# Patient Record
Sex: Male | Born: 1955
Health system: Southern US, Community
[De-identification: ages and names within clinical notes are randomized; demographics above are authoritative.]

## PROBLEM LIST (undated history)

## (undated) DIAGNOSIS — F32A Depression, unspecified: Secondary | ICD-10-CM

## (undated) DIAGNOSIS — Z87442 Personal history of urinary calculi: Secondary | ICD-10-CM

## (undated) DIAGNOSIS — N189 Chronic kidney disease, unspecified: Secondary | ICD-10-CM

## (undated) DIAGNOSIS — T7840XA Allergy, unspecified, initial encounter: Secondary | ICD-10-CM

## (undated) DIAGNOSIS — E785 Hyperlipidemia, unspecified: Secondary | ICD-10-CM

## (undated) DIAGNOSIS — J45909 Unspecified asthma, uncomplicated: Secondary | ICD-10-CM

## (undated) DIAGNOSIS — E119 Type 2 diabetes mellitus without complications: Secondary | ICD-10-CM

## (undated) DIAGNOSIS — D126 Benign neoplasm of colon, unspecified: Secondary | ICD-10-CM

## (undated) DIAGNOSIS — I10 Essential (primary) hypertension: Secondary | ICD-10-CM

## (undated) DIAGNOSIS — Z5189 Encounter for other specified aftercare: Secondary | ICD-10-CM

## (undated) DIAGNOSIS — K5790 Diverticulosis of intestine, part unspecified, without perforation or abscess without bleeding: Secondary | ICD-10-CM

## (undated) DIAGNOSIS — F419 Anxiety disorder, unspecified: Secondary | ICD-10-CM

## (undated) DIAGNOSIS — B019 Varicella without complication: Secondary | ICD-10-CM

## (undated) HISTORY — DX: Unspecified asthma, uncomplicated: J45.909

## (undated) HISTORY — DX: Essential (primary) hypertension: I10

## (undated) HISTORY — PX: POLYPECTOMY: SHX149

## (undated) HISTORY — PX: HERNIA REPAIR: SHX51

## (undated) HISTORY — DX: Depression, unspecified: F32.A

## (undated) HISTORY — DX: Diverticulosis of intestine, part unspecified, without perforation or abscess without bleeding: K57.90

## (undated) HISTORY — DX: Anxiety disorder, unspecified: F41.9

## (undated) HISTORY — DX: Benign neoplasm of colon, unspecified: D12.6

## (undated) HISTORY — DX: Encounter for other specified aftercare: Z51.89

## (undated) HISTORY — DX: Type 2 diabetes mellitus without complications: E11.9

## (undated) HISTORY — PX: WISDOM TOOTH EXTRACTION: SHX21

## (undated) HISTORY — DX: Personal history of urinary calculi: Z87.442

## (undated) HISTORY — DX: Hyperlipidemia, unspecified: E78.5

## (undated) HISTORY — DX: Chronic kidney disease, unspecified: N18.9

## (undated) HISTORY — DX: Varicella without complication: B01.9

## (undated) HISTORY — PX: NASAL SINUS SURGERY: SHX719

## (undated) HISTORY — DX: Allergy, unspecified, initial encounter: T78.40XA

---

## 2006-11-05 HISTORY — PX: COLONOSCOPY: SHX174

## 2014-09-17 ENCOUNTER — Encounter (INDEPENDENT_AMBULATORY_CARE_PROVIDER_SITE_OTHER): Payer: Self-pay

## 2014-09-17 ENCOUNTER — Encounter: Payer: Self-pay | Admitting: Internal Medicine

## 2014-09-17 ENCOUNTER — Ambulatory Visit (INDEPENDENT_AMBULATORY_CARE_PROVIDER_SITE_OTHER): Payer: BC Managed Care – PPO | Admitting: Internal Medicine

## 2014-09-17 VITALS — BP 122/68 | HR 97 | Temp 98.6°F | Ht 69.25 in | Wt 185.0 lb

## 2014-09-17 DIAGNOSIS — E785 Hyperlipidemia, unspecified: Secondary | ICD-10-CM

## 2014-09-17 DIAGNOSIS — I10 Essential (primary) hypertension: Secondary | ICD-10-CM | POA: Insufficient documentation

## 2014-09-17 DIAGNOSIS — E1169 Type 2 diabetes mellitus with other specified complication: Secondary | ICD-10-CM | POA: Insufficient documentation

## 2014-09-17 DIAGNOSIS — K219 Gastro-esophageal reflux disease without esophagitis: Secondary | ICD-10-CM

## 2014-09-17 DIAGNOSIS — J4530 Mild persistent asthma, uncomplicated: Secondary | ICD-10-CM | POA: Insufficient documentation

## 2014-09-17 NOTE — Assessment & Plan Note (Signed)
No recent flares on Advair and Singulair Rarely uses the albuterol

## 2014-09-17 NOTE — Patient Instructions (Signed)

## 2014-09-17 NOTE — Progress Notes (Signed)
Pre visit review using our clinic review tool, if applicable. No additional management support is needed unless otherwise documented below in the visit note. 

## 2014-09-17 NOTE — Progress Notes (Signed)
HPI  Pt presents to the clinic today to establish care. He recently moved from [place]. He is transferring care from Dr. Lunette Stands.   He does have some concerns today about a lesion on the left side of his face. He noticed this one year ago. He reports that it does seem to be getting bigger. It does not itch or hurt. It has not changed in color. He has not had it evaluated before.  Flu: 07/2014 Tetanus: 2014 Pneumonia vaccine: 2013 Zostovax 2013 PSA Screening: yearly Colon Screening: 2008 Vision Screening: 11/2013 Dentist: biannually  Asthma: Mild, persistent. Well controlled on  Advair, singulair. Uses albuterol prn.  HTN: Well controlled on Hyzaar. Denies lower extremity edema.  HLD: Denies myalgias on zocor.   Past Medical History  Diagnosis Date  . Asthma   . Chicken pox   . Allergy   . Hyperlipidemia   . Hypertension   . Personal history of kidney stones     Current Outpatient Prescriptions  Medication Sig Dispense Refill  . fluticasone-salmeterol (ADVAIR HFA) 230-21 MCG/ACT inhaler Inhale 2 puffs into the lungs 2 (two) times daily.    Marland Kitchen losartan-hydrochlorothiazide (HYZAAR) 100-12.5 MG per tablet     . montelukast (SINGULAIR) 10 MG tablet Take 10 mg by mouth at bedtime.    Marland Kitchen PROAIR HFA 108 (90 BASE) MCG/ACT inhaler     . simvastatin (ZOCOR) 20 MG tablet      No current facility-administered medications for this visit.    No Known Allergies  Family History  Problem Relation Age of Onset  . Hypertension Mother   . Alcohol abuse Father   . Cancer Father     Lung  . Diabetes Neg Hx   . Heart disease Neg Hx   . Stroke Neg Hx     History   Social History  . Marital Status: Married    Spouse Name: N/A    Number of Children: N/A  . Years of Education: N/A   Occupational History  . Not on file.   Social History Main Topics  . Smoking status: Never Smoker   . Smokeless tobacco: Never Used  . Alcohol Use: 0.0 oz/week    0 Not specified per week   Comment: occasional  . Drug Use: No  . Sexual Activity: Not Currently   Other Topics Concern  . Not on file   Social History Narrative  . No narrative on file    ROS:  Constitutional: Denies fever, malaise, fatigue, headache or abrupt weight changes.  Respiratory: Denies difficulty breathing, shortness of breath, cough or sputum production.   Cardiovascular: Denies chest pain, chest tightness, palpitations or swelling in the hands or feet.  Gastrointestinal: Pt reports occasional GERD. Denies abdominal pain, bloating, constipation, diarrhea or blood in the stool.  Skin: Pt reports lesion on left side of face. Denies redness, rashes or ulcercations.  Neurological: Denies dizziness, difficulty with memory, difficulty with speech or problems with balance and coordination.   No other specific complaints in a complete review of systems (except as listed in HPI above).  PE:  BP 122/68 mmHg  Pulse 97  Temp(Src) 98.6 F (37 C) (Oral)  Ht 5' 9.25" (1.759 m)  Wt 185 lb (83.915 kg)  BMI 27.12 kg/m2  SpO2 98% Wt Readings from Last 3 Encounters:  09/17/14 185 lb (83.915 kg)    General: Appears his stated age, well developed, well nourished in NAD. Skin: Nodular lesion noted on left temple, appears to be a nevus. Cardiovascular:  Normal rate and rhythm. S1,S2 noted.  No murmur, rubs or gallops noted. No JVD or BLE edema. No carotid bruits noted. Pulmonary/Chest: Normal effort and positive vesicular breath sounds. No respiratory distress. No wheezes, rales or ronchi noted.  Abdomen: Soft and nontender. Normal bowel sounds, no bruits noted. No distention or masses noted. Liver, spleen and kidneys non palpable. Neurological: Alert and oriented.    Assessment and Plan:  Lesion left side of face:  Looks like a nevus He is concerned that it is getting bigger in size He will self refer to dermatology RTC in 6 months for follow up of chronic conditions

## 2014-09-17 NOTE — Assessment & Plan Note (Signed)
No issues on zocor Will continue current dose at this time

## 2014-09-17 NOTE — Assessment & Plan Note (Signed)
Well controlled on Hyzaar Will continue current dose at this time

## 2014-09-17 NOTE — Assessment & Plan Note (Signed)
Occasional Relieved with prn Tums or Rolaids

## 2015-03-18 ENCOUNTER — Telehealth: Payer: Self-pay | Admitting: Internal Medicine

## 2015-03-18 ENCOUNTER — Ambulatory Visit: Payer: BC Managed Care – PPO | Admitting: Internal Medicine

## 2015-03-18 DIAGNOSIS — Z0289 Encounter for other administrative examinations: Secondary | ICD-10-CM

## 2015-03-18 NOTE — Telephone Encounter (Signed)
Yes he needs to follow up

## 2015-03-18 NOTE — Telephone Encounter (Signed)
Patient did not come in for their appointment todayfor 6 month follow up  Please let me know if patient needs to be contacted immediately for follow up or no follow up needed. °

## 2015-03-21 NOTE — Telephone Encounter (Signed)
Called and l/m to r/s appt mf

## 2015-03-23 NOTE — Telephone Encounter (Signed)
L/m to r/s appt  °

## 2015-03-29 NOTE — Telephone Encounter (Signed)
L/m for pt to return call, need to r/s appt

## 2015-05-18 ENCOUNTER — Ambulatory Visit (INDEPENDENT_AMBULATORY_CARE_PROVIDER_SITE_OTHER): Payer: 59 | Admitting: Internal Medicine

## 2015-05-18 ENCOUNTER — Encounter: Payer: Self-pay | Admitting: Internal Medicine

## 2015-05-18 VITALS — BP 134/84 | HR 74 | Temp 98.3°F | Ht 69.12 in | Wt 199.0 lb

## 2015-05-18 DIAGNOSIS — Z0000 Encounter for general adult medical examination without abnormal findings: Secondary | ICD-10-CM | POA: Diagnosis not present

## 2015-05-18 DIAGNOSIS — K219 Gastro-esophageal reflux disease without esophagitis: Secondary | ICD-10-CM | POA: Diagnosis not present

## 2015-05-18 DIAGNOSIS — D229 Melanocytic nevi, unspecified: Secondary | ICD-10-CM

## 2015-05-18 DIAGNOSIS — Z125 Encounter for screening for malignant neoplasm of prostate: Secondary | ICD-10-CM | POA: Diagnosis not present

## 2015-05-18 DIAGNOSIS — Z1211 Encounter for screening for malignant neoplasm of colon: Secondary | ICD-10-CM | POA: Diagnosis not present

## 2015-05-18 DIAGNOSIS — D239 Other benign neoplasm of skin, unspecified: Secondary | ICD-10-CM

## 2015-05-18 DIAGNOSIS — I10 Essential (primary) hypertension: Secondary | ICD-10-CM

## 2015-05-18 DIAGNOSIS — J4530 Mild persistent asthma, uncomplicated: Secondary | ICD-10-CM | POA: Diagnosis not present

## 2015-05-18 DIAGNOSIS — E785 Hyperlipidemia, unspecified: Secondary | ICD-10-CM

## 2015-05-18 DIAGNOSIS — Z23 Encounter for immunization: Secondary | ICD-10-CM | POA: Diagnosis not present

## 2015-05-18 MED ORDER — SIMVASTATIN 20 MG PO TABS: 20.0000 mg | ORAL_TABLET | Freq: Every day | ORAL | Status: DC

## 2015-05-18 MED ORDER — LOSARTAN POTASSIUM-HCTZ 100-12.5 MG PO TABS: 1.0000 | ORAL_TABLET | Freq: Every day | ORAL | Status: DC

## 2015-05-18 NOTE — Assessment & Plan Note (Signed)
Currently not an issue Discussed how weight loss could improve reflux symptoms

## 2015-05-18 NOTE — Assessment & Plan Note (Signed)
He will continue to follow with pulmonology. 

## 2015-05-18 NOTE — Progress Notes (Signed)
Pre visit review using our clinic review tool, if applicable. No additional management support is needed unless otherwise documented below in the visit note. 

## 2015-05-18 NOTE — Assessment & Plan Note (Addendum)
Will check Lipid profile and CMET today Encouraged him to consume a low fat diet Zocor refilled today

## 2015-05-18 NOTE — Assessment & Plan Note (Addendum)
Well controlled ECG today normal CBC and CMET today Losartan HCT refilled today

## 2015-05-18 NOTE — Progress Notes (Signed)
Subjective:    Patient ID: Nathan Conway, male    DOB: 1956-08-05, 59 y.o.   MRN: [id number]  HPI   Pt presents to the clinic today for his annual exam. He is also due for follow up of chronic conditions. He reports a mole on his left temple that he noticed 2 years ago; it has been growing bigger. He has a dermatology appointment coming up in 2 weeks.   Flu: 07/2014 Tetanus: 2015 Pneumovax: 2013 Prevnar: never Zostovax: 2013 PSA Screening: unsure Colon Screening: 2005, would like a referral Vision Screening: 11/2013 Dentist: 2015; is looking for a new dentist  Diet: He is eating more fast food. He does like vegetables but does not eat many fruits. Exercise: None; he does a lot of outdoor manual labor and stays hydrated.   HTN: BP well controlled on Losartan-HCT. His BP today is 134/84. He will need a refill on his medications today.  HLD: He denies myalgias on Zocor. He hopes to get away from eating so much fast food and starting to take his lunch again. He needs a refill on his medications today.  GERD: No complaints; he says he has never had GERD issues.  Mild Persistent Asthma: Not taken Advair since January. Has not taken Singulair since November 2016; uses it in the fall season prn. His symptoms are controlled lately even without the medicines. He has not used rescue inhaler recently. Memory Dance is working well. He will see his pulmonologist in 1 month.  Quit smoking aging 21. Smoked for 10 years.   Review of Systems      Past Medical History  Diagnosis Date  . Asthma   . Chicken pox   . Allergy   . Hyperlipidemia   . Hypertension   . Personal history of kidney stones     Current Outpatient Prescriptions  Medication Sig Dispense Refill  . Fluticasone Furoate-Vilanterol (BREO ELLIPTA) 200-25 MCG/INH AEPB Inhale 2 puffs into the lungs daily.    Marland Kitchen losartan-hydrochlorothiazide (HYZAAR) 100-12.5 MG per tablet Take 1 tablet by mouth daily.     . montelukast (SINGULAIR) 10  MG tablet Take 10 mg by mouth daily as needed.     Marland Kitchen PROAIR HFA 108 (90 BASE) MCG/ACT inhaler Inhale 1-2 puffs into the lungs as needed.     . simvastatin (ZOCOR) 20 MG tablet Take 20 mg by mouth daily at 6 PM.      No current facility-administered medications for this visit.    No Known Allergies  Family History  Problem Relation Age of Onset  . Hypertension Mother   . Alcohol abuse Father   . Cancer Father     Lung  . Diabetes Neg Hx   . Heart disease Neg Hx   . Stroke Neg Hx     History   Social History  . Marital Status: Married    Spouse Name: N/A  . Number of Children: N/A  . Years of Education: N/A   Occupational History  . Not on file.   Social History Main Topics  . Smoking status: Never Smoker   . Smokeless tobacco: Never Used  . Alcohol Use: 0.0 oz/week    0 Standard drinks or equivalent per week     Comment: occasional  . Drug Use: No  . Sexual Activity: Not Currently   Other Topics Concern  . Not on file   Social History Narrative     Constitutional: Denies fever, malaise, fatigue, headache or abrupt weight changes.  HEENT:  Denies eye pain, eye redness, ear pain, ringing in the ears, wax buildup, runny nose, nasal congestion, or sore throat. Respiratory: Denies difficulty breathing, shortness of breath, cough or sputum production.   Cardiovascular: Denies chest pain, chest tightness, palpitations or swelling in the hands or feet.  Gastrointestinal: Denies abdominal pain, bloating, constipation, diarrhea or blood in the stool.  GU: Denies urgency, frequency, pain with urination, burning sensation, blood in urine, odor or discharge. Musculoskeletal:  Denies decrease in range of motion, difficulty with gait, muscle pain or joint pain and swelling.  Skin: Pt reports abnormal mole on left temple. Denies redness, rashes, or ulcercations.  Neurological: Denies dizziness, difficulty with memory, difficulty with speech or problems with balance and  coordination.  Psych: Denies anxiety, depression, SI/HI.  No other specific complaints in a complete review of systems (except as listed in HPI above).  Objective:   Physical Exam   BP 134/84 mmHg  Pulse 74  Temp(Src) 98.3 F (36.8 C) (Oral)  Ht 5' 9.12" (1.756 m)  Wt 199 lb (90.266 kg)  BMI 29.27 kg/m2  SpO2 97% Wt Readings from Last 3 Encounters:  05/18/15 199 lb (90.266 kg)  09/17/14 185 lb (83.915 kg)    General: Appears his stated age, obese in NAD. Skin: Warm, dry and intact. 30mm hyperpigmented papule noted on left temple. HEENT: Head: normal shape and size; Eyes: sclera white, no icterus, conjunctiva pink, PERRLA and EOMs intact; Ears: Tm's gray and intact, normal light reflex; Throat/Mouth: Teeth present, mucosa pink and moist, no exudate, lesions or ulcerations noted.  Neck: Neck supple, trachea midline. No masses, lumps or thyromegaly present.  Cardiovascular: Normal rate and rhythm. S1,S2 noted.  No murmur, rubs or gallops noted. No JVD or BLE edema. No carotid bruits noted. Pulmonary/Chest: Normal effort and positive vesicular breath sounds. No respiratory distress. No wheezes, rales or ronchi noted.  Abdomen: Distended but soft and nontender. Normal bowel sounds, no bruits noted. No distention or masses noted. Liver, spleen and kidneys non palpable. Musculoskeletal: Normal range of motion. Strength 5/5 BUE/BLE. No difficulty with gait.  Neurological: Alert and oriented. Cranial nerves II-XII grossly intact. Coordination normal.  Psychiatric: Mood and affect normal. Behavior is normal. Judgment and thought content normal.        Assessment & Plan:   Preventative Health Maintenance:  Encouraged him to work on diet and exercise Prevnar today Will refer to GI for screening colonoscopy Will refer to opthalmology for annual eye exam  Atypical nevus:  He has a derm appt in 2 weeks He will follow up with them RTC in 6 months to follow up chronic conditions CBC,  CMET, TSH, A1C, Lipids and PSA today

## 2015-05-18 NOTE — Patient Instructions (Signed)

## 2015-05-19 ENCOUNTER — Encounter: Payer: Self-pay | Admitting: Gastroenterology

## 2015-05-19 ENCOUNTER — Other Ambulatory Visit: Payer: Self-pay | Admitting: Internal Medicine

## 2015-05-19 DIAGNOSIS — R748 Abnormal levels of other serum enzymes: Secondary | ICD-10-CM

## 2015-05-19 LAB — COMPREHENSIVE METABOLIC PANEL
ALT: 65 U/L — ABNORMAL HIGH (ref 0–53)
AST: 59 U/L — ABNORMAL HIGH (ref 0–37)
Albumin: 4.1 g/dL (ref 3.5–5.2)
Alkaline Phosphatase: 61 U/L (ref 39–117)
BUN: 11 mg/dL (ref 6–23)
CALCIUM: 9.4 mg/dL (ref 8.4–10.5)
CHLORIDE: 103 meq/L (ref 96–112)
CO2: 29 meq/L (ref 19–32)
Creatinine, Ser: 0.95 mg/dL (ref 0.40–1.50)
GFR: 85.68 mL/min (ref >60.00)
GLUCOSE: 104 mg/dL — AB (ref 70–99)
POTASSIUM: 4.1 meq/L (ref 3.5–5.1)
Sodium: 142 mEq/L (ref 135–145)
TOTAL PROTEIN: 7.1 g/dL (ref 6.0–8.3)
Total Bilirubin: 0.7 mg/dL (ref 0.2–1.2)

## 2015-05-19 LAB — PSA: PSA: 1 ng/mL (ref 0.10–4.00)

## 2015-05-19 LAB — CBC WITH DIFFERENTIAL/PLATELET
BASOS ABS: 0.1 10*3/uL (ref 0.0–0.1)
Basophils Relative: 1.2 % (ref 0.0–3.0)
Eosinophils Absolute: 0.3 10*3/uL (ref 0.0–0.7)
Eosinophils Relative: 3.1 % (ref 0.0–5.0)
HCT: 41.1 % (ref 39.0–52.0)
Hemoglobin: 13.7 g/dL (ref 13.0–17.0)
LYMPHS PCT: 24.2 % (ref 12.0–46.0)
Lymphs Abs: 2.6 10*3/uL (ref 0.7–4.0)
MCHC: 33.3 g/dL (ref 30.0–36.0)
MCV: 100.3 fl — ABNORMAL HIGH (ref 78.0–100.0)
Monocytes Absolute: 0.7 10*3/uL (ref 0.1–1.0)
Monocytes Relative: 6.4 % (ref 3.0–12.0)
NEUTROS ABS: 7 10*3/uL (ref 1.4–7.7)
NEUTROS PCT: 65.1 % (ref 43.0–77.0)
Platelets: 259 10*3/uL (ref 150.0–400.0)
RBC: 4.09 Mil/uL — ABNORMAL LOW (ref 4.22–5.81)
RDW: 14.6 % (ref 11.5–15.5)
WBC: 10.7 10*3/uL — AB (ref 4.0–10.5)

## 2015-05-19 LAB — LIPID PANEL
CHOL/HDL RATIO: 3
Cholesterol: 146 mg/dL (ref 0–200)
HDL: 51.7 mg/dL (ref >39.00)
LDL Cholesterol: 76 mg/dL (ref 0–99)
NonHDL: 94.3
TRIGLYCERIDES: 90 mg/dL (ref 0.0–149.0)
VLDL: 18 mg/dL (ref 0.0–40.0)

## 2015-05-19 LAB — TSH: TSH: 1 u[IU]/mL (ref 0.35–4.50)

## 2015-05-19 LAB — HEMOGLOBIN A1C: Hgb A1c MFr Bld: 6.2 % (ref 4.6–6.5)

## 2015-05-20 NOTE — Addendum Note (Signed)
Addended by: Lurlean Nanny on: 05/20/2015 08:47 AM   Modules accepted: Orders

## 2015-06-10 ENCOUNTER — Ambulatory Visit
Admission: RE | Admit: 2015-06-10 | Discharge: 2015-06-10 | Disposition: A | Discharge status: Home / Self Care | Payer: 59 | Source: Ambulatory Visit | Attending: Internal Medicine | Admitting: Internal Medicine

## 2015-06-10 DIAGNOSIS — R748 Abnormal levels of other serum enzymes: Secondary | ICD-10-CM | POA: Insufficient documentation

## 2015-06-10 DIAGNOSIS — K760 Fatty (change of) liver, not elsewhere classified: Secondary | ICD-10-CM | POA: Diagnosis not present

## 2015-08-05 ENCOUNTER — Encounter: Payer: 59 | Admitting: Gastroenterology

## 2015-09-14 ENCOUNTER — Telehealth: Payer: Self-pay | Admitting: Internal Medicine

## 2015-09-14 NOTE — Telephone Encounter (Signed)
Pt called ? The no show charge for 03/17/14 He stated he does not remember making that appointment and at the time he had no insurance  When he got new insurance he called to schedule his cpx

## 2015-09-19 ENCOUNTER — Ambulatory Visit (AMBULATORY_SURGERY_CENTER): Payer: Self-pay | Admitting: *Deleted

## 2015-09-19 VITALS — Ht 70.0 in | Wt 197.6 lb

## 2015-09-19 DIAGNOSIS — Z86010 Personal history of colonic polyps: Secondary | ICD-10-CM

## 2015-09-19 MED ORDER — SUPREP BOWEL PREP KIT 17.5-3.13-1.6 GM/177ML PO SOLN: 1.0000 | Freq: Once | ORAL | Status: DC

## 2015-09-19 NOTE — Progress Notes (Signed)
Patient denies any allergies to egg or soy products. Patient denies complications with anesthesia/sedation.  Patient denies oxygen use at home and denies diet medications. Emmi instructions for colonoscopy explained and given to patient.  

## 2015-09-22 ENCOUNTER — Encounter: Payer: Self-pay | Admitting: Gastroenterology

## 2015-10-03 ENCOUNTER — Ambulatory Visit (AMBULATORY_SURGERY_CENTER): Payer: 59 | Admitting: Gastroenterology

## 2015-10-03 ENCOUNTER — Encounter: Payer: Self-pay | Admitting: Gastroenterology

## 2015-10-03 VITALS — BP 134/76 | HR 70 | Temp 98.5°F | Resp 19 | Ht 70.0 in | Wt 197.0 lb

## 2015-10-03 DIAGNOSIS — Z86010 Personal history of colonic polyps: Secondary | ICD-10-CM

## 2015-10-03 DIAGNOSIS — D125 Benign neoplasm of sigmoid colon: Secondary | ICD-10-CM | POA: Diagnosis not present

## 2015-10-03 DIAGNOSIS — D124 Benign neoplasm of descending colon: Secondary | ICD-10-CM

## 2015-10-03 DIAGNOSIS — D122 Benign neoplasm of ascending colon: Secondary | ICD-10-CM | POA: Diagnosis not present

## 2015-10-03 DIAGNOSIS — D123 Benign neoplasm of transverse colon: Secondary | ICD-10-CM

## 2015-10-03 DIAGNOSIS — D120 Benign neoplasm of cecum: Secondary | ICD-10-CM

## 2015-10-03 MED ORDER — SODIUM CHLORIDE 0.9 % IV SOLN: 500.0000 mL | INTRAVENOUS | Status: DC

## 2015-10-03 NOTE — Patient Instructions (Addendum)
Impressions/recommendations;  Polyps (handout given) Diverticulosis (handout given) High Fiber Diet (handout given) Hemorrhoids (handout given)  No aspirin containing products, NSAIDS, or antiinflammatory medications for 2 weeks. May resume 10/18/15. If needed, Tylenol only.  YOU HAD AN ENDOSCOPIC PROCEDURE TODAY AT [facility]:   Refer to the procedure report that was given to you for any specific questions about what was found during the examination.  If the procedure report does not answer your questions, please call your gastroenterologist to clarify.  If you requested that your care partner not be given the details of your procedure findings, then the procedure report has been included in a sealed envelope for you to review at your convenience later.  YOU SHOULD EXPECT: Some feelings of bloating in the abdomen. Passage of more gas than usual.  Walking can help get rid of the air that was put into your GI tract during the procedure and reduce the bloating. If you had a lower endoscopy (such as a colonoscopy or flexible sigmoidoscopy) you may notice spotting of blood in your stool or on the toilet paper. If you underwent a bowel prep for your procedure, you may not have a normal bowel movement for a few days.  Please Note:  You might notice some irritation and congestion in your nose or some drainage.  This is from the oxygen used during your procedure.  There is no need for concern and it should clear up in a day or so.  SYMPTOMS TO REPORT IMMEDIATELY:   Following lower endoscopy (colonoscopy or flexible sigmoidoscopy):  Excessive amounts of blood in the stool  Significant tenderness or worsening of abdominal pains  Swelling of the abdomen that is new, acute  Fever of 100F or higher  For urgent or emergent issues, a gastroenterologist can be reached at any hour by calling [phone].   DIET: Your first meal following the procedure should be a small meal and then  it is ok to progress to your normal diet. Heavy or fried foods are harder to digest and may make you feel nauseous or bloated.  Likewise, meals heavy in dairy and vegetables can increase bloating.  Drink plenty of fluids but you should avoid alcoholic beverages for 24 hours.  ACTIVITY:  You should plan to take it easy for the rest of today and you should NOT DRIVE or use heavy machinery until tomorrow (because of the sedation medicines used during the test).    FOLLOW UP: Our staff will call the number listed on your records the next business day following your procedure to check on you and address any questions or concerns that you may have regarding the information given to you following your procedure. If we do not reach you, we will leave a message.  However, if you are feeling well and you are not experiencing any problems, there is no need to return our call.  We will assume that you have returned to your regular daily activities without incident.  If any biopsies were taken you will be contacted by phone or by letter within the next 1-3 weeks.  Please call us at [phone] if you have not heard about the biopsies in 3 weeks.    SIGNATURES/CONFIDENTIALITY: You and/or your care partner have signed paperwork which will be entered into your electronic medical record.  These signatures attest to the fact that that the information above on your After Visit Summary has been reviewed and is understood.  Full responsibility of the confidentiality  of this discharge information lies with you and/or your care-partner.

## 2015-10-03 NOTE — Progress Notes (Signed)
Called to room to assist during endoscopic procedure.  Patient ID and intended procedure confirmed with present staff. Received instructions for my participation in the procedure from the performing physician.  

## 2015-10-03 NOTE — Progress Notes (Signed)
Report to PACU, RN, vss, BBS= Clear.  

## 2015-10-03 NOTE — Op Note (Signed)
[facility]  [address] [place], [id number]   COLONOSCOPY PROCEDURE REPORT  PATIENT: Nathan Conway, Nathan Conway  MR#: [id number] BIRTHDATE: 1955-12-21 , 44  yrs. old GENDER: male ENDOSCOPIST: Ladene Artist, MD, [facility] REFERRED BY: Webb Silversmith, NP PROCEDURE DATE:  10/03/2015 PROCEDURE:   Colonoscopy, surveillance and Colonoscopy with snare polypectomy First Screening Colonoscopy - Avg.  risk and is 50 yrs.  old or older - No.  Prior Negative Screening - Now for repeat screening. N/A  History of Adenoma - Now for follow-up colonoscopy & has been > or = to 3 yrs.  Yes hx of adenoma.  Has been 3 or more years since last colonoscopy.  Polyps removed today? Yes ASA CLASS:   Class II INDICATIONS:Surveillance due to prior colonic neoplasia and PH Colon Adenoma. MEDICATIONS: Monitored anesthesia care and Propofol 300 mg IV DESCRIPTION OF PROCEDURE:   After the risks benefits and alternatives of the procedure were thoroughly explained, informed consent was obtained.  The digital rectal exam revealed no abnormalities of the rectum.   The LB [id number]  endoscope was introduced through the anus and advanced to the cecum, which was identified by both the appendix and ileocecal valve. No adverse events experienced.   The quality of the prep was good.  (Suprep was used)  The instrument was then slowly withdrawn as the colon was fully examined. Estimated blood loss is zero unless otherwise noted in this procedure report.    COLON FINDINGS: Six sessile polyps measuring 6-7 mm in size were found in the transverse colon(2), descending colon (1), at the cecum (1), and in the ascending colon (2).  Polypectomies were performed with a cold snare.  The resection was complete, the polyp tissue was completely retrieved and sent to histology.   A semi-pedunculated polyp measuring 8 mm in size was found in the sigmoid colon.  A polypectomy was performed using snare cautery. The resection  was complete, the polyp tissue was completely retrieved and sent to histology.   There was moderate diverticulosis noted in the sigmoid colon.   The examination was otherwise normal.  Retroflexed views revealed internal Grade I hemorrhoids. The time to cecum = 1.7 Withdrawal time = 15.1   The scope was withdrawn and the procedure completed.  COMPLICATIONS: There were no immediate complications.   ENDOSCOPIC IMPRESSION: 1.   Six sessile polyps in the transverse colon, descending colon, at the cecum, and in the ascending colon; polypectomies performed with a cold snare 2.   Semi-pedunculated polyp in the sigmoid colon; polypectomy performed using snare cautery 3.   Moderate diverticulosis in the sigmoid colon 4.   Grade l internal hemorrhoids  RECOMMENDATIONS: 1.  Hold Aspirin and all other NSAIDS for 2 weeks. 2.  Await pathology results 3.  High fiber diet with liberal fluid intake. 4.  Repeat Colonoscopy in 2-3 years pending pathology review.  eSigned:  Ladene Artist, MD, [facility] 10/03/2015 10:49 AM     PATIENT NAME:  Nathan Conway, Nathan Conway MR#: [id number]

## 2015-10-04 ENCOUNTER — Telehealth: Payer: Self-pay | Admitting: *Deleted

## 2015-10-04 NOTE — Telephone Encounter (Signed)
  Follow up Call-  Call back number 10/03/2015  Post procedure Call Back phone  # [phone]  Permission to leave phone message Yes   LMOM at [phone] (according to snap shot number); tried above number and it was a wrong number

## 2015-10-10 ENCOUNTER — Encounter: Payer: Self-pay | Admitting: Gastroenterology

## 2015-11-14 ENCOUNTER — Other Ambulatory Visit: Payer: Self-pay | Admitting: Internal Medicine

## 2015-11-18 ENCOUNTER — Ambulatory Visit: Payer: 59 | Admitting: Internal Medicine

## 2015-11-20 IMAGING — US US ABDOMEN COMPLETE
1 series · 14 of 25 positions shown · non-contrast
Comparison: None.

CLINICAL DATA: Elevated liver enzymes

EXAM:
ULTRASOUND ABDOMEN COMPLETE

[Series 1: us abdomen complete · 0.26mm/px · 14 of 98 slices shown]
[im 1/98]
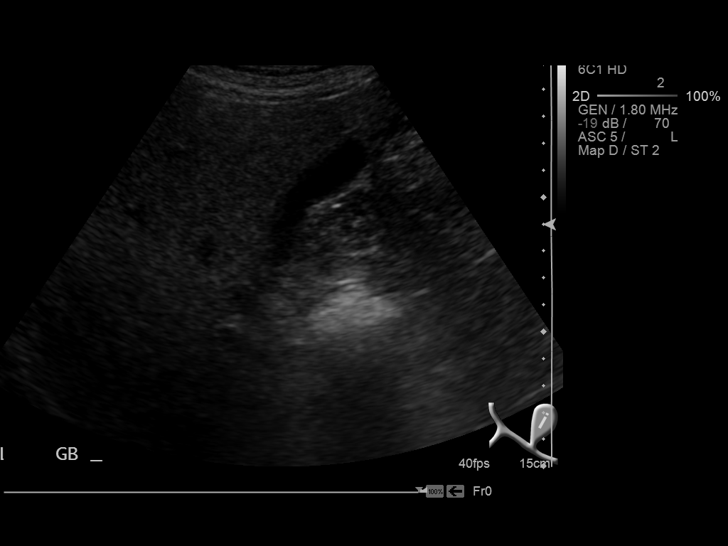
[im 9/98]
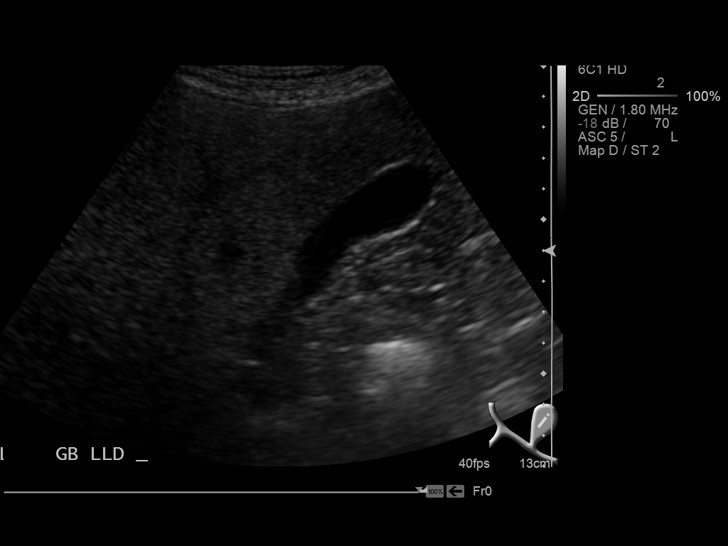
[im 17/98]
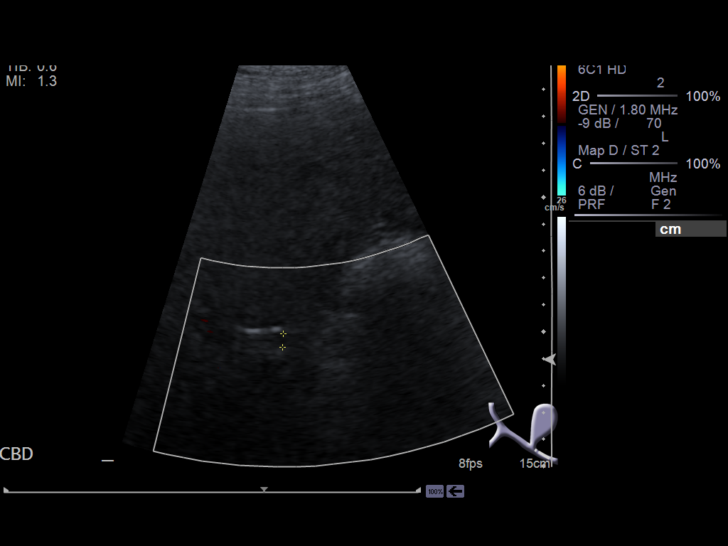
[im 25/98]
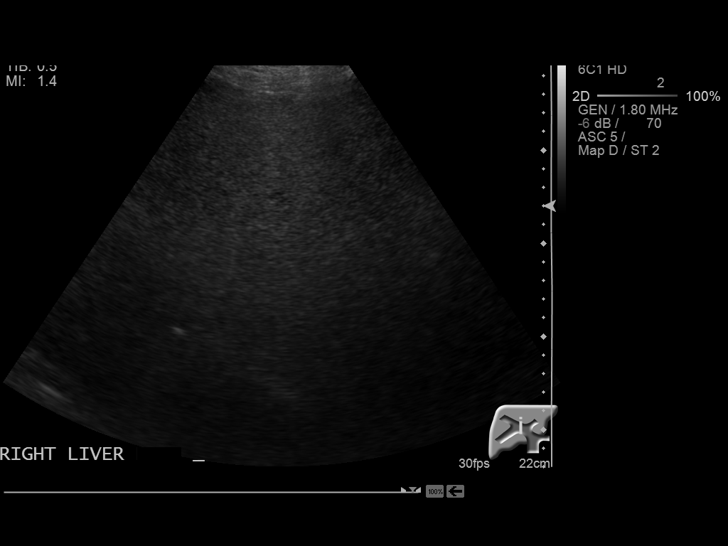
[im 33/98]
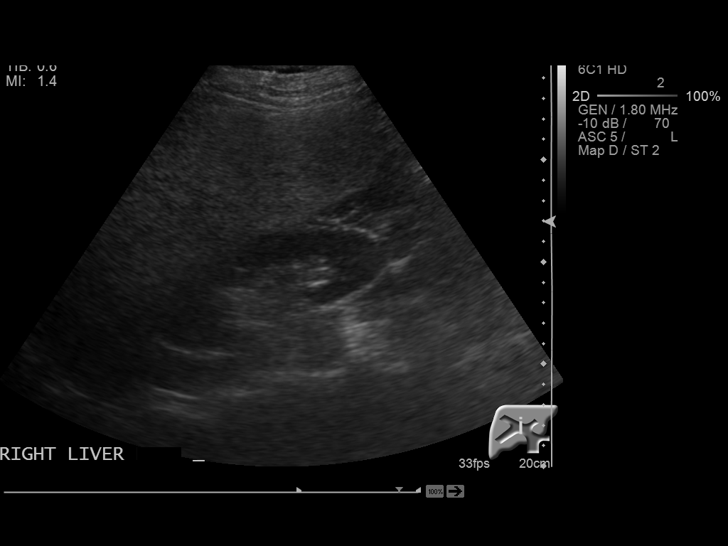
[im 37/98]
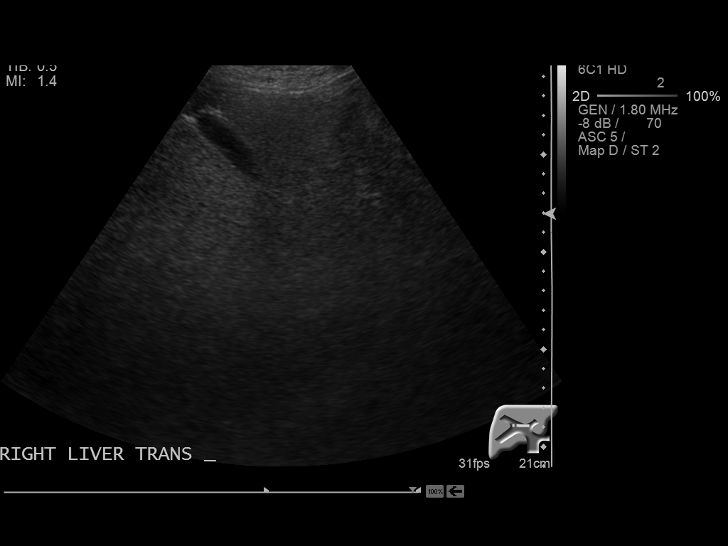
[im 45/98]
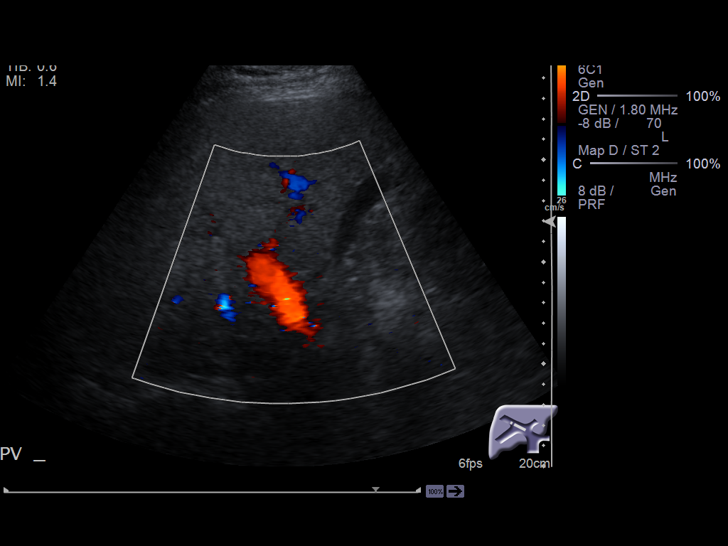
[im 53/98]
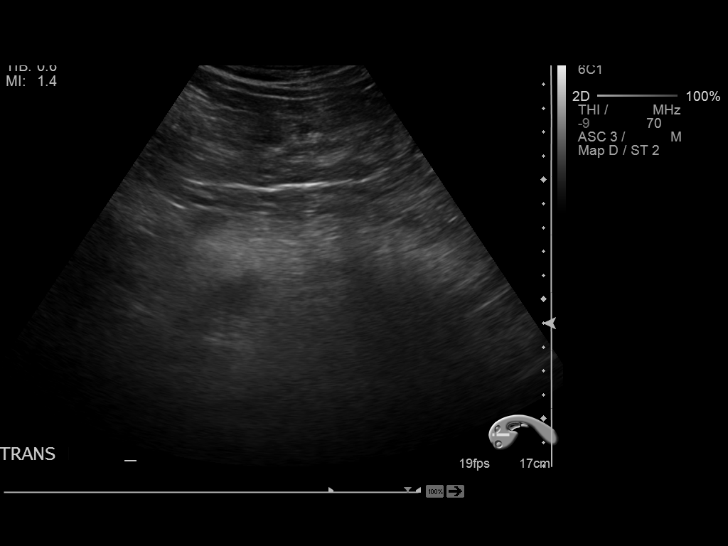
[im 61/98]
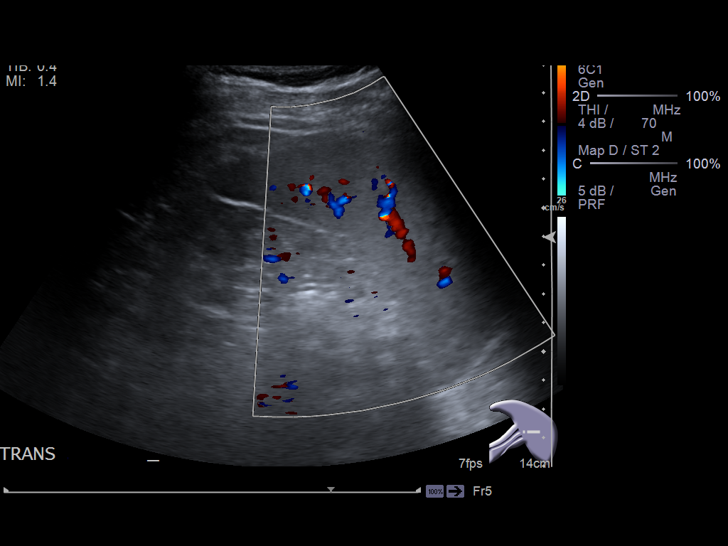
[im 65/98]
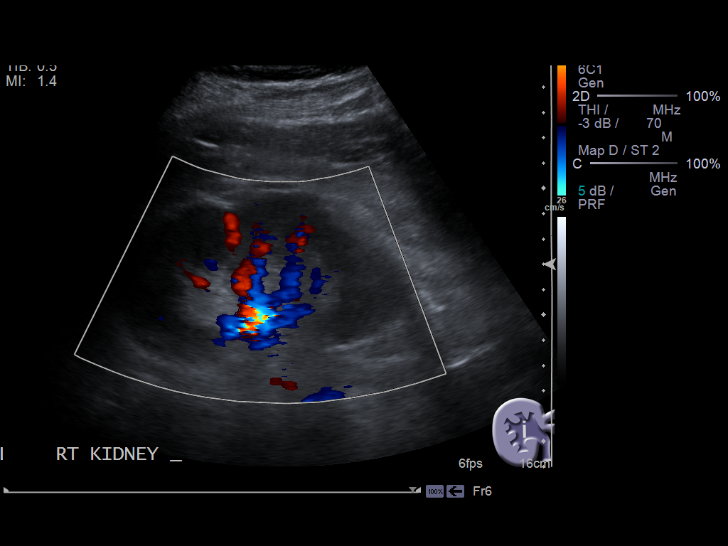
[im 73/98]
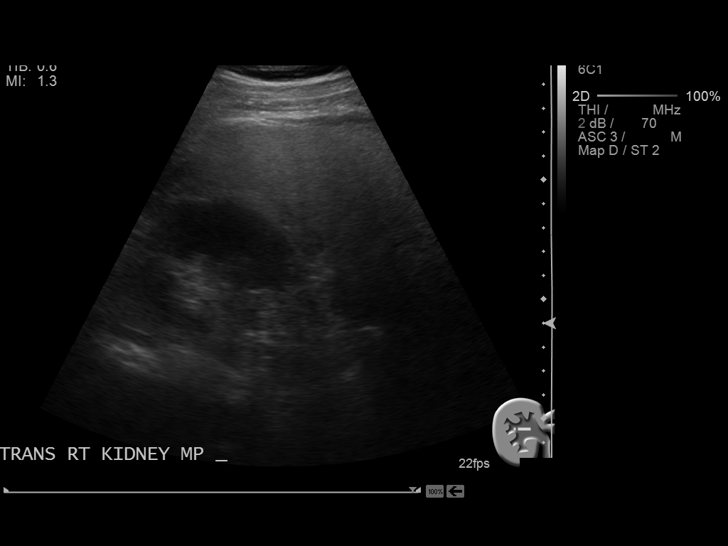
[im 81/98]
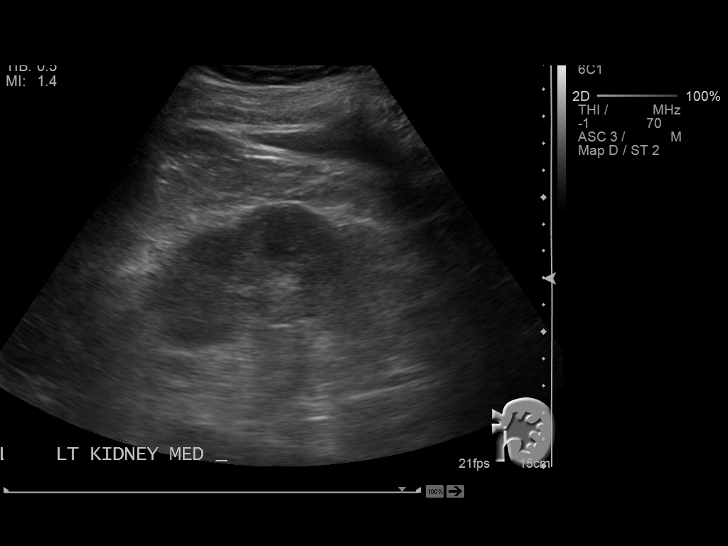
[im 89/98]
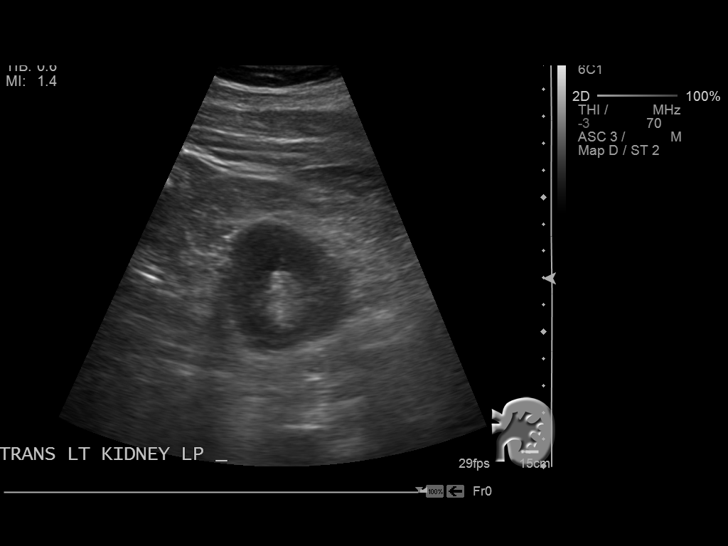
[im 98/98]
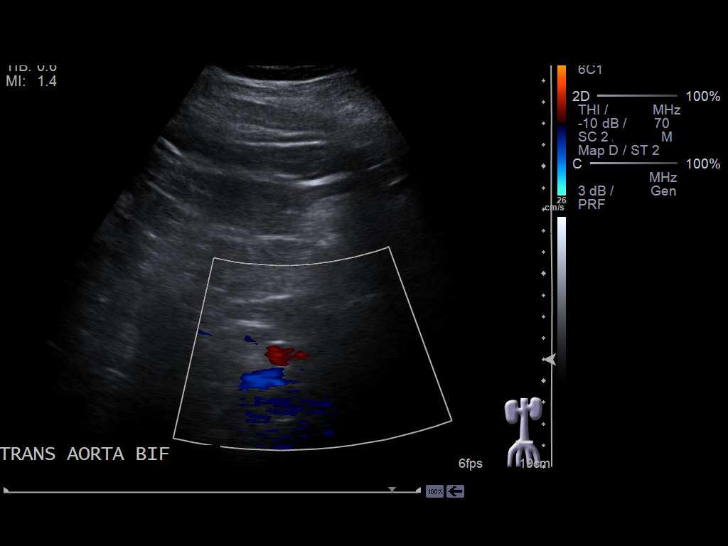

[14 of 25 positions shown; findings below may reference images not displayed]

FINDINGS: Gallbladder: No gallstones or wall thickening visualized. There is
no pericholecystic fluid. No sonographic Murphy sign noted.

Common bile duct: Diameter: 5 mm. There is no intrahepatic, common
hepatic, or common bile duct dilatation.

Liver: No focal lesion identified. Liver echogenicity is diffusely
increased.

IVC: No abnormality visualized.

Pancreas: No mass or inflammatory focus.

Spleen: Size and appearance within normal limits.

Right Kidney: Length: 11.7 cm. Echogenicity within normal limits. No
mass or hydronephrosis visualized.

Left Kidney: Length: 12.7 cm. Echogenicity within normal limits. No
mass or hydronephrosis visualized.

Abdominal aorta: No aneurysm visualized.

Other findings: No demonstrable ascites.
IMPRESSION: Diffuse increased liver echogenicity, most likely due to hepatic
steatosis. While no focal liver lesions are identified, it must be
cautioned that the sensitivity of ultrasound for focal liver lesions
is diminished significantly in this circumstance. Study otherwise
unremarkable.

## 2015-12-16 ENCOUNTER — Encounter: Payer: Self-pay | Admitting: Internal Medicine

## 2015-12-16 ENCOUNTER — Ambulatory Visit (INDEPENDENT_AMBULATORY_CARE_PROVIDER_SITE_OTHER): Payer: 59 | Admitting: Internal Medicine

## 2015-12-16 VITALS — BP 134/78 | HR 69 | Temp 98.7°F | Wt 188.0 lb

## 2015-12-16 DIAGNOSIS — E785 Hyperlipidemia, unspecified: Secondary | ICD-10-CM

## 2015-12-16 DIAGNOSIS — J4530 Mild persistent asthma, uncomplicated: Secondary | ICD-10-CM | POA: Diagnosis not present

## 2015-12-16 DIAGNOSIS — K760 Fatty (change of) liver, not elsewhere classified: Secondary | ICD-10-CM | POA: Diagnosis not present

## 2015-12-16 DIAGNOSIS — I10 Essential (primary) hypertension: Secondary | ICD-10-CM

## 2015-12-16 DIAGNOSIS — K219 Gastro-esophageal reflux disease without esophagitis: Secondary | ICD-10-CM | POA: Diagnosis not present

## 2015-12-16 LAB — COMPREHENSIVE METABOLIC PANEL
ALBUMIN: 4.3 g/dL (ref 3.5–5.2)
ALK PHOS: 64 U/L (ref 39–117)
ALT: 33 U/L (ref 0–53)
AST: 34 U/L (ref 0–37)
BILIRUBIN TOTAL: 0.6 mg/dL (ref 0.2–1.2)
BUN: 17 mg/dL (ref 6–23)
CO2: 29 mEq/L (ref 19–32)
CREATININE: 0.94 mg/dL (ref 0.40–1.50)
Calcium: 10.2 mg/dL (ref 8.4–10.5)
Chloride: 104 mEq/L (ref 96–112)
GFR: 86.56 mL/min (ref >60.00)
Glucose, Bld: 105 mg/dL — ABNORMAL HIGH (ref 70–99)
POTASSIUM: 4.2 meq/L (ref 3.5–5.1)
SODIUM: 141 meq/L (ref 135–145)
TOTAL PROTEIN: 8 g/dL (ref 6.0–8.3)

## 2015-12-16 LAB — LIPID PANEL
CHOLESTEROL: 124 mg/dL (ref 0–200)
HDL: 38.9 mg/dL — ABNORMAL LOW (ref >39.00)
LDL Cholesterol: 67 mg/dL (ref 0–99)
NONHDL: 84.79
Total CHOL/HDL Ratio: 3
Triglycerides: 87 mg/dL (ref 0.0–149.0)
VLDL: 17.4 mg/dL (ref 0.0–40.0)

## 2015-12-16 NOTE — Assessment & Plan Note (Signed)
Controlled on Losartan HCT CBC and CMET today

## 2015-12-16 NOTE — Progress Notes (Signed)
Pre visit review using our clinic review tool, if applicable. No additional management support is needed unless otherwise documented below in the visit note. 

## 2015-12-16 NOTE — Progress Notes (Signed)
Subjective:    Patient ID: Nathan Conway, male    DOB: 1956-01-23, 60 y.o.   MRN: [id number]  HPI   Pt presents to the clinic today for follow up of chronic conditions.   HTN: BP well controlled on Losartan-HCT. His BP today is 134/78. He will need a refill on his medications today. ECG from 05/2015 reviewed.  HLD: His last LDL was 76. He denies myalgias on Zocor. He hopes to get away from eating so much fast food and starting to take his lunch again. He needs a refill on his medications today.  GERD: No complaints; he says he has never had GERD issues.  Mild Persistent Asthma: He has not used his Albuterol inhaler recently. Memory Dance is working well. He takes Singulair only when he needs it. He does follow with pulmonology, last saw him within the last 2 months. Quit smoking aging 21. Smoked for 10 years.  Elevated liver enzymes: Ultrasound from 06/2015 c/w hepatic steatosis.  Review of Systems      Past Medical History  Diagnosis Date  . Asthma   . Chicken pox   . Allergy   . Hyperlipidemia   . Hypertension   . Personal history of kidney stones     passed stones - no surgery    Current Outpatient Prescriptions  Medication Sig Dispense Refill  . Fluticasone Furoate-Vilanterol (BREO ELLIPTA) 200-25 MCG/INH AEPB Inhale 2 puffs into the lungs daily.    Marland Kitchen losartan-hydrochlorothiazide (HYZAAR) 100-12.5 MG tablet TAKE 1 TABLET BY MOUTH DAILY. 30 tablet 1  . montelukast (SINGULAIR) 10 MG tablet Take 10 mg by mouth daily as needed.     Marland Kitchen PROAIR HFA 108 (90 BASE) MCG/ACT inhaler Inhale 1-2 puffs into the lungs as needed.     . simvastatin (ZOCOR) 20 MG tablet TAKE 1 TABLET (20 MG TOTAL) BY MOUTH DAILY AT 6 PM. 30 tablet 1   No current facility-administered medications for this visit.    No Known Allergies  Family History  Problem Relation Age of Onset  . Hypertension Mother   . Alcohol abuse Father   . Cancer Father     Lung  . Diabetes Neg Hx   . Heart disease Neg Hx   .  Stroke Neg Hx   . Colon cancer Neg Hx   . Esophageal cancer Neg Hx   . Rectal cancer Neg Hx   . Stomach cancer Neg Hx     Social History   Social History  . Marital Status: Married    Spouse Name: N/A  . Number of Children: N/A  . Years of Education: N/A   Occupational History  . Not on file.   Social History Main Topics  . Smoking status: Former Smoker -- 2.00 packs/day    Types: Cigarettes    Quit date: 11/05/1977  . Smokeless tobacco: Never Used  . Alcohol Use: 1.2 oz/week    2 Shots of liquor per week     Comment: socially  . Drug Use: No  . Sexual Activity: Not Currently   Other Topics Concern  . Not on file   Social History Narrative     Constitutional: Denies fever, malaise, fatigue, headache or abrupt weight changes.  HEENT: Denies eye pain, eye redness, ear pain, ringing in the ears, wax buildup, runny nose, nasal congestion, or sore throat. Respiratory: Denies difficulty breathing, shortness of breath, cough or sputum production.   Cardiovascular: Denies chest pain, chest tightness, palpitations or swelling in the hands or feet.  Gastrointestinal: Denies abdominal pain, bloating, constipation, diarrhea or blood in the stool.  GU: Denies urgency, frequency, pain with urination, burning sensation, blood in urine, odor or discharge. Musculoskeletal:  Denies decrease in range of motion, difficulty with gait, muscle pain or joint pain and swelling.  Skin: Denies redness, rashes, or ulcercations.  Neurological: Denies dizziness, difficulty with memory, difficulty with speech or problems with balance and coordination.  Psych: Denies anxiety, depression, SI/HI.  No other specific complaints in a complete review of systems (except as listed in HPI above).  Objective:   Physical Exam   BP 134/78 mmHg  Pulse 69  Temp(Src) 98.7 F (37.1 C) (Oral)  Wt 188 lb (85.276 kg)  SpO2 97%  Wt Readings from Last 3 Encounters:  10/03/15 197 lb (89.359 kg)  09/19/15 197  lb 9.6 oz (89.631 kg)  05/18/15 199 lb (90.266 kg)    General: Appears his stated age, obese in NAD. Neck: Neck supple, trachea midline. No masses, lumps or thyromegaly present.  Cardiovascular: Normal rate and rhythm. S1,S2 noted.  No murmur, rubs or gallops noted. No JVD or BLE edema. No carotid bruits noted. Pulmonary/Chest: Normal effort and positive vesicular breath sounds. No respiratory distress. No wheezes, rales or ronchi noted.  Abdomen: Soft and nontender. Normal bowel sounds. No distention or masses noted. Liver, spleen and kidneys non palpable. Neurological: Alert and oriented.         Assessment & Plan:

## 2015-12-16 NOTE — Patient Instructions (Signed)

## 2015-12-16 NOTE — Assessment & Plan Note (Signed)
Currently not an issue Will continue to monitor 

## 2015-12-16 NOTE — Assessment & Plan Note (Signed)
Encouraged low fat diet and exercise Will continue to monitor liver enzymes

## 2015-12-16 NOTE — Assessment & Plan Note (Signed)
Follows with pulmonology Continue Advair, Breo and Singulair

## 2015-12-16 NOTE — Assessment & Plan Note (Signed)
Will repeat Lipid Profile and CMET today Encouraged him to consume a low fat diet Continue Zocor, will adjust as needed based on labs

## 2015-12-17 LAB — CBC
HEMATOCRIT: 43.2 % (ref 39.0–52.0)
HEMOGLOBIN: 14.3 g/dL (ref 13.0–17.0)
MCHC: 33.1 g/dL (ref 30.0–36.0)
MCV: 99.3 fl (ref 78.0–100.0)
Platelets: 240 10*3/uL (ref 150.0–400.0)
RBC: 4.35 Mil/uL (ref 4.22–5.81)
RDW: 12.3 % (ref 11.5–15.5)
WBC: 10.5 10*3/uL (ref 4.0–10.5)

## 2015-12-19 ENCOUNTER — Other Ambulatory Visit (INDEPENDENT_AMBULATORY_CARE_PROVIDER_SITE_OTHER): Payer: 59

## 2015-12-19 DIAGNOSIS — R7309 Other abnormal glucose: Secondary | ICD-10-CM

## 2015-12-19 LAB — HEMOGLOBIN A1C: HEMOGLOBIN A1C: 6.4 % (ref 4.6–6.5)

## 2016-01-10 ENCOUNTER — Other Ambulatory Visit: Payer: Self-pay | Admitting: Internal Medicine

## 2016-06-15 ENCOUNTER — Encounter: Payer: 59 | Admitting: Internal Medicine

## 2016-06-28 ENCOUNTER — Other Ambulatory Visit: Payer: Self-pay | Admitting: Internal Medicine

## 2016-07-13 ENCOUNTER — Ambulatory Visit: Payer: Self-pay | Admitting: Internal Medicine

## 2016-07-20 ENCOUNTER — Encounter: Payer: 59 | Admitting: Internal Medicine

## 2016-08-26 ENCOUNTER — Other Ambulatory Visit: Payer: Self-pay | Admitting: Internal Medicine

## 2016-09-07 ENCOUNTER — Encounter: Payer: Self-pay | Admitting: Internal Medicine

## 2016-09-07 ENCOUNTER — Ambulatory Visit: Payer: 59 | Admitting: Internal Medicine

## 2016-09-07 ENCOUNTER — Ambulatory Visit (INDEPENDENT_AMBULATORY_CARE_PROVIDER_SITE_OTHER): Payer: 59 | Admitting: Internal Medicine

## 2016-09-07 VITALS — BP 122/80 | HR 76 | Temp 97.8°F | Ht 69.75 in | Wt 198.8 lb

## 2016-09-07 DIAGNOSIS — Z23 Encounter for immunization: Secondary | ICD-10-CM | POA: Diagnosis not present

## 2016-09-07 DIAGNOSIS — Z1159 Encounter for screening for other viral diseases: Secondary | ICD-10-CM

## 2016-09-07 DIAGNOSIS — Z0000 Encounter for general adult medical examination without abnormal findings: Secondary | ICD-10-CM

## 2016-09-07 DIAGNOSIS — M7521 Bicipital tendinitis, right shoulder: Secondary | ICD-10-CM

## 2016-09-07 DIAGNOSIS — Z114 Encounter for screening for human immunodeficiency virus [HIV]: Secondary | ICD-10-CM

## 2016-09-07 DIAGNOSIS — Z0001 Encounter for general adult medical examination with abnormal findings: Secondary | ICD-10-CM

## 2016-09-07 LAB — LIPID PANEL
CHOL/HDL RATIO: 3
Cholesterol: 136 mg/dL (ref 0–200)
HDL: 41.8 mg/dL (ref >39.00)
NONHDL: 94.66
TRIGLYCERIDES: 218 mg/dL — AB (ref 0.0–149.0)
VLDL: 43.6 mg/dL — ABNORMAL HIGH (ref 0.0–40.0)

## 2016-09-07 LAB — VITAMIN D 25 HYDROXY (VIT D DEFICIENCY, FRACTURES): VITD: 15.04 ng/mL — AB (ref 30.00–100.00)

## 2016-09-07 LAB — COMPREHENSIVE METABOLIC PANEL
ALT: 56 U/L — AB (ref 0–53)
AST: 46 U/L — AB (ref 0–37)
Albumin: 4.2 g/dL (ref 3.5–5.2)
Alkaline Phosphatase: 57 U/L (ref 39–117)
BILIRUBIN TOTAL: 0.4 mg/dL (ref 0.2–1.2)
BUN: 16 mg/dL (ref 6–23)
CALCIUM: 9.6 mg/dL (ref 8.4–10.5)
CHLORIDE: 105 meq/L (ref 96–112)
CO2: 27 meq/L (ref 19–32)
CREATININE: 0.92 mg/dL (ref 0.40–1.50)
GFR: 88.53 mL/min (ref >60.00)
GLUCOSE: 149 mg/dL — AB (ref 70–99)
Potassium: 4.6 mEq/L (ref 3.5–5.1)
SODIUM: 139 meq/L (ref 135–145)
Total Protein: 7 g/dL (ref 6.0–8.3)

## 2016-09-07 LAB — CBC
HCT: 42.7 % (ref 39.0–52.0)
Hemoglobin: 14.3 g/dL (ref 13.0–17.0)
MCHC: 33.5 g/dL (ref 30.0–36.0)
MCV: 98.5 fl (ref 78.0–100.0)
PLATELETS: 226 10*3/uL (ref 150.0–400.0)
RBC: 4.33 Mil/uL (ref 4.22–5.81)
RDW: 13.8 % (ref 11.5–15.5)
WBC: 9.4 10*3/uL (ref 4.0–10.5)

## 2016-09-07 LAB — PSA: PSA: 1.19 ng/mL (ref 0.10–4.00)

## 2016-09-07 LAB — HEMOGLOBIN A1C: HEMOGLOBIN A1C: 6.5 % (ref 4.6–6.5)

## 2016-09-07 LAB — LDL CHOLESTEROL, DIRECT: LDL DIRECT: 74 mg/dL

## 2016-09-07 MED ORDER — PREDNISONE 10 MG PO TABS: ORAL_TABLET | ORAL | 0 refills | Status: DC

## 2016-09-07 NOTE — Patient Instructions (Signed)

## 2016-09-07 NOTE — Progress Notes (Signed)
Subjective:    Patient ID: Nathan Conway, male    DOB: [birth year], 60 y.o.   MRN: [id number]  HPI  Pt presents to the clinic today for his annual exam.  Flu: 07/2014 Tetanus: 04/2014 Pneumovax: 04/2012 Prevnar: 05/2015 Zostovax: 12/2011 PSA Screening: 05/2015 Colon Screening:  09/2015 Vision Screening: 01/2016 Dentist: as needed  Diet: He does eat meat. He consumes fruits and veggies daily/ He does eat some fried food. He drinks mostly water, some caffeine. Exercise: None  Review of Systems      Past Medical History:  Diagnosis Date  . Allergy   . Asthma   . Chicken pox   . Hyperlipidemia   . Hypertension   . Personal history of kidney stones    passed stones - no surgery    Current Outpatient Prescriptions  Medication Sig Dispense Refill  . Fluticasone Furoate-Vilanterol (BREO ELLIPTA) 200-25 MCG/INH AEPB Inhale 2 puffs into the lungs daily.    Marland Kitchen losartan-hydrochlorothiazide (HYZAAR) 100-12.5 MG tablet TAKE 1 TABLET BY MOUTH DAILY. 30 tablet 0  . montelukast (SINGULAIR) 10 MG tablet Take 10 mg by mouth daily as needed.     Marland Kitchen PROAIR HFA 108 (90 BASE) MCG/ACT inhaler Inhale 1-2 puffs into the lungs as needed.     . simvastatin (ZOCOR) 20 MG tablet TAKE 1 TABLET (20 MG TOTAL) BY MOUTH DAILY AT 6 PM. 30 tablet 0   No current facility-administered medications for this visit.     No Known Allergies  Family History  Problem Relation Age of Onset  . Hypertension Mother   . Alcohol abuse Father   . Cancer Father     Lung  . Diabetes Neg Hx   . Heart disease Neg Hx   . Stroke Neg Hx   . Colon cancer Neg Hx   . Esophageal cancer Neg Hx   . Rectal cancer Neg Hx   . Stomach cancer Neg Hx     Social History   Social History  . Marital status: Married    Spouse name: N/A  . Number of children: N/A  . Years of education: N/A   Occupational History  . Not on file.   Social History Main Topics  . Smoking status: Former Smoker    Packs/day: 2.00    Types:  Cigarettes    Quit date: 11/05/1977  . Smokeless tobacco: Never Used  . Alcohol use 1.2 oz/week    2 Shots of liquor per week     Comment: socially  . Drug use: No  . Sexual activity: Not Currently   Other Topics Concern  . Not on file   Social History Narrative  . No narrative on file     Constitutional: Denies fever, malaise, fatigue, headache or abrupt weight changes.  HEENT: Pt reports occasional runny nose. Denies eye pain, eye redness, ear pain, ringing in the ears, wax buildup,  nasal congestion, bloody nose, or sore throat. Respiratory: Denies difficulty breathing, shortness of breath, cough or sputum production.   Cardiovascular: Denies chest pain, chest tightness, palpitations or swelling in the hands or feet.  Gastrointestinal: Denies abdominal pain, bloating, constipation, diarrhea or blood in the stool.  GU: Denies urgency, frequency, pain with urination, burning sensation, blood in urine, odor or discharge. Musculoskeletal: Pt reports right shoulder pain and back pain.  Denies decrease in range of motion, difficulty with gait, muscle pain or joint swelling.  Skin: Denies redness, rashes, lesions or ulcercations.  Neurological: Denies dizziness, difficulty with memory, difficulty with  speech or problems with balance and coordination.  Psych: Denies anxiety, depression, SI/HI.  No other specific complaints in a complete review of systems (except as listed in HPI above).  Objective:   Physical Exam   BP 122/80   Pulse 76   Temp 97.8 F (36.6 C) (Oral)   Ht 5' 9.75" (1.772 m)   Wt 198 lb 12 oz (90.2 kg)   SpO2 97%   BMI 28.72 kg/m  Wt Readings from Last 3 Encounters:  09/07/16 198 lb 12 oz (90.2 kg)  12/16/15 188 lb (85.3 kg)  10/03/15 197 lb (89.4 kg)    General: Appears his stated age, well developed, well nourished in NAD. Skin: Warm, dry and intact.  HEENT: Head: normal shape and size; Eyes: sclera white, no icterus, conjunctiva pink, PERRLA and EOMs  intact; Ears: Tm's gray and intact, normal light reflex;  Throat/Mouth: Teeth present, mucosa pink and moist, no exudate, lesions or ulcerations noted.  Neck:  Neck supple, trachea midline. No masses, lumps or thyromegaly present.  Cardiovascular: Normal rate and rhythm. S1,S2 noted.  No murmur, rubs or gallops noted. No JVD or BLE edema. No carotid bruits noted. Pulmonary/Chest: Normal effort and positive vesicular breath sounds. No respiratory distress. No wheezes, rales or ronchi noted.  Abdomen: Soft and nontender. Normal bowel sounds. No distention or masses noted. Liver, spleen and kidneys non palpable. Musculoskeletal:  Decreased internal and external rotation of the right shoulder. Pain with palpation over the right biceps tendonitis. Strength 4/5 RUE, 5/5 LUE. Decreased flexion of the spine. Normal extension, rotation and lateral bending. No bony tenderness noted over the lumbar spine. Strength 5/5 BUE/BLE. No difficulty with gait.  Neurological: Alert and oriented. Cranial nerves II-XII grossly intact. Coordination normal.  Psychiatric: Mood and affect normal. Behavior is normal. Judgment and thought content normal.    BMET    Component Value Date/Time   NA 141 12/16/2015 1330   K 4.2 12/16/2015 1330   CL 104 12/16/2015 1330   CO2 29 12/16/2015 1330   GLUCOSE 105 (H) 12/16/2015 1330   BUN 17 12/16/2015 1330   CREATININE 0.94 12/16/2015 1330   CALCIUM 10.2 12/16/2015 1330    Lipid Panel     Component Value Date/Time   CHOL 124 12/16/2015 1330   TRIG 87.0 12/16/2015 1330   HDL 38.90 (L) 12/16/2015 1330   CHOLHDL 3 12/16/2015 1330   VLDL 17.4 12/16/2015 1330   LDLCALC 67 12/16/2015 1330    CBC    Component Value Date/Time   WBC 10.5 12/16/2015 1330   RBC 4.35 12/16/2015 1330   HGB 14.3 12/16/2015 1330   HCT 43.2 12/16/2015 1330   PLT 240.0 12/16/2015 1330   MCV 99.3 12/16/2015 1330   MCHC 33.1 12/16/2015 1330   RDW 12.3 12/16/2015 1330   LYMPHSABS 2.6 05/18/2015  1613   MONOABS 0.7 05/18/2015 1613   EOSABS 0.3 05/18/2015 1613   BASOSABS 0.1 05/18/2015 1613    Hgb A1C Lab Results  Component Value Date   HGBA1C 6.4 12/19/2015        Assessment & Plan:   Preventative Health Maintenance:  Flu shot today Tetanus, Pneumovax, Prevnar, and Zostovax UTD Colon Screening UTD Encouraged him to consume a balanced diet and exercise regimen Advised him to see an eye doctor and dentist at least annually Will check CBC, CMET, Lipid, A1C, PSA, HIV and Hep C today  Right Biceps Tendonitis;  eRx for Pred Taper x 6 days Shoulder exercises given Avoid overuse  RTC in 1 year for annual exam/followup, sooner if  Webb Silversmith, NP

## 2016-09-07 NOTE — Addendum Note (Signed)
Addended by: Lurlean Nanny on: 09/07/2016 05:07 PM   Modules accepted: Orders

## 2016-09-08 LAB — HEPATITIS C ANTIBODY: HCV AB: NEGATIVE

## 2016-09-08 LAB — HIV ANTIBODY (ROUTINE TESTING W REFLEX): HIV: NONREACTIVE

## 2016-09-17 ENCOUNTER — Encounter: Payer: Self-pay | Admitting: Internal Medicine

## 2016-09-26 ENCOUNTER — Other Ambulatory Visit: Payer: Self-pay | Admitting: Internal Medicine

## 2016-09-26 MED ORDER — VITAMIN D (ERGOCALCIFEROL) 1.25 MG (50000 UNIT) PO CAPS: 50000.0000 [IU] | ORAL_CAPSULE | ORAL | 0 refills | Status: DC

## 2016-11-02 ENCOUNTER — Other Ambulatory Visit: Payer: Self-pay | Admitting: Internal Medicine

## 2016-12-01 ENCOUNTER — Other Ambulatory Visit: Payer: Self-pay | Admitting: Internal Medicine

## 2016-12-17 ENCOUNTER — Other Ambulatory Visit: Payer: Self-pay | Admitting: Internal Medicine

## 2017-01-11 ENCOUNTER — Other Ambulatory Visit: Payer: Self-pay

## 2017-01-11 MED ORDER — LOSARTAN POTASSIUM-HCTZ 100-12.5 MG PO TABS: 1.0000 | ORAL_TABLET | Freq: Every day | ORAL | 1 refills | Status: DC

## 2017-01-11 MED ORDER — SIMVASTATIN 20 MG PO TABS: ORAL_TABLET | ORAL | 1 refills | Status: DC

## 2017-01-11 NOTE — Addendum Note (Signed)
Addended by: Lurlean Nanny on: 01/11/2017 10:04 AM   Modules accepted: Orders

## 2017-03-23 ENCOUNTER — Other Ambulatory Visit: Payer: Self-pay | Admitting: Internal Medicine

## 2017-04-23 ENCOUNTER — Other Ambulatory Visit: Payer: Self-pay | Admitting: Internal Medicine

## 2017-05-22 ENCOUNTER — Other Ambulatory Visit: Payer: Self-pay | Admitting: Internal Medicine

## 2017-06-05 ENCOUNTER — Ambulatory Visit (INDEPENDENT_AMBULATORY_CARE_PROVIDER_SITE_OTHER): Payer: 59 | Admitting: Internal Medicine

## 2017-06-05 ENCOUNTER — Encounter: Payer: Self-pay | Admitting: Internal Medicine

## 2017-06-05 VITALS — BP 112/64 | HR 64 | Temp 98.0°F | Wt 191.5 lb

## 2017-06-05 DIAGNOSIS — E119 Type 2 diabetes mellitus without complications: Secondary | ICD-10-CM | POA: Diagnosis not present

## 2017-06-05 DIAGNOSIS — I10 Essential (primary) hypertension: Secondary | ICD-10-CM

## 2017-06-05 DIAGNOSIS — J4530 Mild persistent asthma, uncomplicated: Secondary | ICD-10-CM

## 2017-06-05 DIAGNOSIS — E559 Vitamin D deficiency, unspecified: Secondary | ICD-10-CM

## 2017-06-05 DIAGNOSIS — E7800 Pure hypercholesterolemia, unspecified: Secondary | ICD-10-CM

## 2017-06-05 LAB — COMPREHENSIVE METABOLIC PANEL
ALBUMIN: 4.3 g/dL (ref 3.5–5.2)
ALK PHOS: 47 U/L (ref 39–117)
ALT: 50 U/L (ref 0–53)
AST: 37 U/L (ref 0–37)
BUN: 17 mg/dL (ref 6–23)
CALCIUM: 9.6 mg/dL (ref 8.4–10.5)
CO2: 28 mEq/L (ref 19–32)
CREATININE: 0.95 mg/dL (ref 0.40–1.50)
Chloride: 101 mEq/L (ref 96–112)
GFR: 85.1 mL/min (ref >60.00)
Glucose, Bld: 100 mg/dL — ABNORMAL HIGH (ref 70–99)
POTASSIUM: 4.1 meq/L (ref 3.5–5.1)
SODIUM: 136 meq/L (ref 135–145)
TOTAL PROTEIN: 7.3 g/dL (ref 6.0–8.3)
Total Bilirubin: 0.7 mg/dL (ref 0.2–1.2)

## 2017-06-05 LAB — HEMOGLOBIN A1C: HEMOGLOBIN A1C: 6.3 % (ref 4.6–6.5)

## 2017-06-05 LAB — LIPID PANEL
CHOLESTEROL: 130 mg/dL (ref 0–200)
HDL: 47.2 mg/dL (ref >39.00)
LDL Cholesterol: 62 mg/dL (ref 0–99)
NonHDL: 82.48
TRIGLYCERIDES: 102 mg/dL (ref 0.0–149.0)
Total CHOL/HDL Ratio: 3
VLDL: 20.4 mg/dL (ref 0.0–40.0)

## 2017-06-05 LAB — VITAMIN D 25 HYDROXY (VIT D DEFICIENCY, FRACTURES): VITD: 38.07 ng/mL (ref 30.00–100.00)

## 2017-06-05 NOTE — Assessment & Plan Note (Signed)
Controlled on Lisinopril HCT CMET today 

## 2017-06-05 NOTE — Assessment & Plan Note (Signed)
Discussed diabetes and standards of medical care Discussed low carb diet and exercise for weight loss He has already lost 7 lbs since his last visit Repeat A1C today No microalbumin secondary to ACEI therapy Foot exam today Encouraged yearly eye exams

## 2017-06-05 NOTE — Patient Instructions (Signed)
Diabetes Mellitus and Standards of Medical Care Managing diabetes (diabetes mellitus) can be complicated. Your diabetes treatment may be managed by a team of health care providers, including:  A diet and nutrition specialist (registered dietitian).  A nurse.  A certified diabetes educator (CDE).  A diabetes specialist (endocrinologist).  An eye doctor.  A primary care provider.  A dentist.  Your health care providers follow a schedule in order to help you get the best quality of care. The following schedule is a general guideline for your diabetes management plan. Your health care providers may also give you more specific instructions. HbA1c ( hemoglobin A1c) test This test provides information about blood sugar (glucose) control over the previous 2-3 months. It is used to check whether your diabetes management plan needs to be adjusted.  If you are meeting your treatment goals, this test is done at least 2 times a year.  If you are not meeting treatment goals or if your treatment goals have changed, this test is done 4 times a year.  Blood pressure test  This test is done at every routine medical visit. For most people, the goal is less than 130/80. Ask your health care provider what your goal blood pressure should be. Dental and eye exams  Visit your dentist two times a year.  If you have type 1 diabetes, get an eye exam 3-5 years after you are diagnosed, and then once a year after your first exam. ? If you were diagnosed with type 1 diabetes as a child, get an eye exam when you are age 16 or older and have had diabetes for 3-5 years. After the first exam, you should get an eye exam once a year.  If you have type 2 diabetes, have an eye exam as soon as you are diagnosed, and then once a year after your first exam. Foot care exam  Visual foot exams are done at every routine medical visit. The exams check for cuts, bruises, redness, blisters, sores, or other problems with the  feet.  A complete foot exam is done by your health care provider once a year. This exam includes an inspection of the structure and skin of your feet, and a check of the pulses and sensation in your feet. ? Type 1 diabetes: Get your first exam 3-5 years after diagnosis. ? Type 2 diabetes: Get your first exam as soon as you are diagnosed.  Check your feet every day for cuts, bruises, redness, blisters, or sores. If you have any of these or other problems that are not healing, contact your health care provider. Kidney function test ( urine microalbumin)  This test is done once a year. ? Type 1 diabetes: Get your first test 5 years after diagnosis. ? Type 2 diabetes: Get your first test as soon as you are diagnosed.  If you have chronic kidney disease (CKD), get a serum creatinine and estimated glomerular filtration rate (eGFR) test once a year. Lipid profile (cholesterol, HDL, LDL, triglycerides)  This test should be done when you are diagnosed with diabetes, and every 5 years after the first test. If you are on medicines to lower your cholesterol, you may need to get this test done every year. ? The goal for LDL is less than 100 mg/dL (5.5 mmol/L). If you are at high risk, the goal is less than 70 mg/dL (3.9 mmol/L). ? The goal for HDL is 40 mg/dL (2.2 mmol/L) for men and 50 mg/dL(2.8 mmol/L) for women. An HDL  cholesterol of 60 mg/dL (3.3 mmol/L) or higher gives some protection against heart disease. ? The goal for triglycerides is less than 150 mg/dL (8.3 mmol/L). Immunizations  The yearly flu (influenza) vaccine is recommended for everyone 6 months or older who has diabetes.  The pneumonia (pneumococcal) vaccine is recommended for everyone 2 years or older who has diabetes. If you are 97 or older, you may get the pneumonia vaccine as a series of two separate shots.  The hepatitis B vaccine is recommended for adults shortly after they have been diagnosed with diabetes.  The Tdap  (tetanus, diphtheria, and pertussis) vaccine should be given: ? According to normal childhood vaccination schedules, for children. ? Every 10 years, for adults who have diabetes.  The shingles vaccine is recommended for people who have had chicken pox and are 50 years or older. Mental and emotional health  Screening for symptoms of eating disorders, anxiety, and depression is recommended at the time of diagnosis and afterward as needed. If your screening shows that you have symptoms (you have a positive screening result), you may need further evaluation and be referred to a mental health care provider. Diabetes self-management education  Education about how to manage your diabetes is recommended at diagnosis and ongoing as needed. Treatment plan  Your treatment plan will be reviewed at every medical visit. Summary  Managing diabetes (diabetes mellitus) can be complicated. Your diabetes treatment may be managed by a team of health care providers.  Your health care providers follow a schedule in order to help you get the best quality of care.  Standards of care including having regular physical exams, blood tests, blood pressure monitoring, immunizations, screening tests, and education about how to manage your diabetes.  Your health care providers may also give you more specific instructions based on your individual health. This information is not intended to replace advice given to you by your health care provider. Make sure you discuss any questions you have with your health care provider. Document Released: 08/19/2009 Document Revised: 07/20/2016 Document Reviewed: 07/20/2016 Elsevier Interactive Patient Education  [employer].

## 2017-06-05 NOTE — Progress Notes (Signed)
Subjective:    Patient ID: Nathan Conway, male    DOB: Aug 22, 1956, 61 y.o.   MRN: [id number]  HPI  Pt presents to the clinic today for follow up chronic conditions.  DM 2: Diagnosed 09/2016. He never returned to discuss treatment options. He denies blurred vision, increased thirst, urinary frequency or numbness and tingling in his hands or feet.  HLD: His triglycerides were 218, LDL within normal range. He is taking Simvastatin daily as prescribed. He denies myalgias. He tries to consume a low fat diet.  HTN: His BP today is 122/64. He is taking Lisinopril HCT at prescribed. ECG from 05/2015 reviewed.  Mild Persistent Asthma: He is taking Breo and Singulair daily as prescribed. He has an Albuterol inhaler but does not use it.   Vit D Deficiency: He was supplemented with Erogocalciferol for 12 weeks back in 09/2016. He never returned to recheck the levels. He is not taking any Vit D OTC.  Review of Systems      Past Medical History:  Diagnosis Date  . Allergy   . Asthma   . Chicken pox   . Hyperlipidemia   . Hypertension   . Personal history of kidney stones    passed stones - no surgery    Current Outpatient Prescriptions  Medication Sig Dispense Refill  . Fluticasone Furoate-Vilanterol (BREO ELLIPTA) 200-25 MCG/INH AEPB Inhale 2 puffs into the lungs daily.    Marland Kitchen losartan-hydrochlorothiazide (HYZAAR) 100-12.5 MG tablet Take 1 tablet by mouth daily. 30 tablet 0  . montelukast (SINGULAIR) 10 MG tablet Take 10 mg by mouth daily as needed.     . predniSONE (DELTASONE) 10 MG tablet Take 6 tabs day 1, 5 tabs day 2, 4 tabs day 3, 3 tabs day 4, 2 tabs day 5, 1 tab day 6 21 tablet 0  . PROAIR HFA 108 (90 BASE) MCG/ACT inhaler Inhale 1-2 puffs into the lungs as needed.     . simvastatin (ZOCOR) 20 MG tablet Take 1 tablet (20 mg total) by mouth daily at 6 PM. 30 tablet 0  . Vitamin D, Ergocalciferol, (DRISDOL) 50000 units CAPS capsule Take 1 capsule (50,000 Units total) by mouth every 7  (seven) days. 12 capsule 0   No current facility-administered medications for this visit.     No Known Allergies  Family History  Problem Relation Age of Onset  . Hypertension Mother   . Alcohol abuse Father   . Cancer Father        Lung  . Diabetes Neg Hx   . Heart disease Neg Hx   . Stroke Neg Hx   . Colon cancer Neg Hx   . Esophageal cancer Neg Hx   . Rectal cancer Neg Hx   . Stomach cancer Neg Hx     Social History   Social History  . Marital status: Married    Spouse name: N/A  . Number of children: N/A  . Years of education: N/A   Occupational History  . Not on file.   Social History Main Topics  . Smoking status: Former Smoker    Packs/day: 2.00    Types: Cigarettes    Quit date: 11/05/1977  . Smokeless tobacco: Never Used  . Alcohol use 1.2 oz/week    2 Shots of liquor per week     Comment: socially  . Drug use: No  . Sexual activity: Not Currently   Other Topics Concern  . Not on file   Social History Narrative  .  No narrative on file     Constitutional: Denies fever, malaise, fatigue, headache or abrupt weight changes.  Respiratory: Denies difficulty breathing, shortness of breath, cough or sputum production.   Cardiovascular: Denies chest pain, chest tightness, palpitations or swelling in the hands or feet.  GU: Denies urgency, frequency, pain with urination, burning sensation, blood in urine, odor or discharge. Skin: Denies redness, rashes, lesions or ulcercations.  Neurological: Denies dizziness, difficulty with memory, difficulty with speech or problems with balance and coordination.   No other specific complaints in a complete review of systems (except as listed in HPI above).  Objective:   Physical Exam   BP 112/64   Pulse 64   Temp 98 F (36.7 C) (Oral)   Wt 191 lb 8 oz (86.9 kg)   BMI 27.67 kg/m  Wt Readings from Last 3 Encounters:  06/05/17 191 lb 8 oz (86.9 kg)  09/07/16 198 lb 12 oz (90.2 kg)  12/16/15 188 lb (85.3 kg)     General: Appears his stated age, well developed, well nourished in NAD. Skin: Warm, dry and intact. No ulcerations noted. Cardiovascular: Normal rate and rhythm. S1,S2 noted.  No murmur, rubs or gallops noted. No JVD or BLE edema. No carotid bruits noted. Pulmonary/Chest: Normal effort and positive vesicular breath sounds. No respiratory distress. No wheezes, rales or ronchi noted.  Neurological: Alert and oriented. Sensation intact to BLE.  BMET    Component Value Date/Time   NA 139 09/07/2016 1439   K 4.6 09/07/2016 1439   CL 105 09/07/2016 1439   CO2 27 09/07/2016 1439   GLUCOSE 149 (H) 09/07/2016 1439   BUN 16 09/07/2016 1439   CREATININE 0.92 09/07/2016 1439   CALCIUM 9.6 09/07/2016 1439    Lipid Panel     Component Value Date/Time   CHOL 136 09/07/2016 1439   TRIG 218.0 (H) 09/07/2016 1439   HDL 41.80 09/07/2016 1439   CHOLHDL 3 09/07/2016 1439   VLDL 43.6 (H) 09/07/2016 1439   LDLCALC 67 12/16/2015 1330    CBC    Component Value Date/Time   WBC 9.4 09/07/2016 1439   RBC 4.33 09/07/2016 1439   HGB 14.3 09/07/2016 1439   HCT 42.7 09/07/2016 1439   PLT 226.0 09/07/2016 1439   MCV 98.5 09/07/2016 1439   MCHC 33.5 09/07/2016 1439   RDW 13.8 09/07/2016 1439   LYMPHSABS 2.6 05/18/2015 1613   MONOABS 0.7 05/18/2015 1613   EOSABS 0.3 05/18/2015 1613   BASOSABS 0.1 05/18/2015 1613    Hgb A1C Lab Results  Component Value Date   HGBA1C 6.5 09/07/2016           Assessment & Plan:

## 2017-06-05 NOTE — Assessment & Plan Note (Signed)
Encouraged him to consume a low fat diet CMET and Lipid profile today Continue Simvastatin as prescribed, will adjust if needed

## 2017-06-05 NOTE — Assessment & Plan Note (Signed)
Continue Breo He will follow up with pulmonology

## 2017-06-05 NOTE — Assessment & Plan Note (Signed)
Repeat Vit D today 

## 2017-06-06 MED ORDER — SIMVASTATIN 20 MG PO TABS: 20.0000 mg | ORAL_TABLET | Freq: Every day | ORAL | 2 refills | Status: DC

## 2017-06-06 MED ORDER — LOSARTAN POTASSIUM-HCTZ 100-12.5 MG PO TABS: 1.0000 | ORAL_TABLET | Freq: Every day | ORAL | 2 refills | Status: DC

## 2017-06-06 NOTE — Addendum Note (Signed)
Addended by: Lurlean Nanny on: 06/06/2017 06:10 PM   Modules accepted: Orders

## 2017-08-05 DIAGNOSIS — Z23 Encounter for immunization: Secondary | ICD-10-CM | POA: Diagnosis not present

## 2017-08-05 DIAGNOSIS — J45909 Unspecified asthma, uncomplicated: Secondary | ICD-10-CM | POA: Diagnosis not present

## 2017-08-05 DIAGNOSIS — J301 Allergic rhinitis due to pollen: Secondary | ICD-10-CM | POA: Diagnosis not present

## 2017-09-13 ENCOUNTER — Encounter: Payer: 59 | Admitting: Internal Medicine

## 2018-01-31 ENCOUNTER — Telehealth: Payer: Self-pay | Admitting: Internal Medicine

## 2018-01-31 ENCOUNTER — Other Ambulatory Visit: Payer: Self-pay

## 2018-01-31 MED ORDER — LOSARTAN POTASSIUM-HCTZ 100-12.5 MG PO TABS: 1.0000 | ORAL_TABLET | Freq: Every day | ORAL | 0 refills | Status: DC

## 2018-01-31 NOTE — Telephone Encounter (Signed)
Copied from [place] [id number]. Topic: Quick Communication - Rx Refill/Question >> Jan 31, 2018  8:58 AM Ether Griffins B wrote: Medication: simvastatin      losartan  Has the patient contacted their pharmacy? Yes.   (Agent: If no, request that the patient contact the pharmacy for the refill.) Preferred Pharmacy (with phone number or street name): [facility], [place]: Please be advised that RX refills may take up to 3 business days. We ask that you follow-up with your pharmacy.

## 2018-02-03 ENCOUNTER — Ambulatory Visit: Payer: 59 | Admitting: Internal Medicine

## 2018-02-27 ENCOUNTER — Ambulatory Visit: Payer: 59 | Admitting: Internal Medicine

## 2018-02-27 DIAGNOSIS — H1712 Central corneal opacity, left eye: Secondary | ICD-10-CM | POA: Diagnosis not present

## 2018-02-27 DIAGNOSIS — H25813 Combined forms of age-related cataract, bilateral: Secondary | ICD-10-CM | POA: Diagnosis not present

## 2018-03-12 ENCOUNTER — Other Ambulatory Visit: Payer: Self-pay | Admitting: Internal Medicine

## 2018-03-12 NOTE — Telephone Encounter (Signed)
Copied from [place] [id number]. Topic: Quick Communication - See Telephone Encounter >> Mar 12, 2018 12:27 PM Hewitt Shorts wrote: Pt is needing a refill on losartan-hydrochlorothiazide and simvastatin   [place]   Best number [phone]

## 2018-03-13 MED ORDER — SIMVASTATIN 20 MG PO TABS: 20.0000 mg | ORAL_TABLET | Freq: Every day | ORAL | 0 refills | Status: DC

## 2018-03-13 MED ORDER — LOSARTAN POTASSIUM-HCTZ 100-12.5 MG PO TABS: 1.0000 | ORAL_TABLET | Freq: Every day | ORAL | 0 refills | Status: DC

## 2018-03-13 NOTE — Telephone Encounter (Signed)
Simvastatin and Hyzaar refill Last OV:06/05/17; upcoming physical appointment 05/02/18 Last refill: Simvastatin 06/06/17 90 tab/2 refills; Hyzaar 01/31/18 30 tab/0 refills [id number]: [place], [place] [phone] (Phone) [phone] (Fax)

## 2018-03-13 NOTE — Telephone Encounter (Signed)
Pt last seen 06/05/17; pt has appt on 05/02/18. Refilled per protocol to [facility]. Unable to reach pt by phone and mail box full so could not leave v/m.

## 2018-03-14 ENCOUNTER — Encounter: Payer: 59 | Admitting: Internal Medicine

## 2018-05-02 ENCOUNTER — Ambulatory Visit (INDEPENDENT_AMBULATORY_CARE_PROVIDER_SITE_OTHER): Payer: 59 | Admitting: Internal Medicine

## 2018-05-02 ENCOUNTER — Encounter

## 2018-05-02 ENCOUNTER — Encounter: Payer: Self-pay | Admitting: Internal Medicine

## 2018-05-02 VITALS — BP 124/82 | HR 78 | Temp 98.3°F | Ht 69.0 in | Wt 189.0 lb

## 2018-05-02 DIAGNOSIS — E119 Type 2 diabetes mellitus without complications: Secondary | ICD-10-CM

## 2018-05-02 DIAGNOSIS — Z23 Encounter for immunization: Secondary | ICD-10-CM | POA: Diagnosis not present

## 2018-05-02 DIAGNOSIS — E7800 Pure hypercholesterolemia, unspecified: Secondary | ICD-10-CM

## 2018-05-02 DIAGNOSIS — Z0000 Encounter for general adult medical examination without abnormal findings: Secondary | ICD-10-CM | POA: Diagnosis not present

## 2018-05-02 DIAGNOSIS — J4530 Mild persistent asthma, uncomplicated: Secondary | ICD-10-CM | POA: Diagnosis not present

## 2018-05-02 DIAGNOSIS — I10 Essential (primary) hypertension: Secondary | ICD-10-CM

## 2018-05-02 DIAGNOSIS — Z125 Encounter for screening for malignant neoplasm of prostate: Secondary | ICD-10-CM | POA: Diagnosis not present

## 2018-05-02 MED ORDER — SIMVASTATIN 20 MG PO TABS: 20.0000 mg | ORAL_TABLET | Freq: Every day | ORAL | 3 refills | Status: DC

## 2018-05-02 MED ORDER — LOSARTAN POTASSIUM-HCTZ 100-12.5 MG PO TABS: 1.0000 | ORAL_TABLET | Freq: Every day | ORAL | 3 refills | Status: DC

## 2018-05-02 NOTE — Assessment & Plan Note (Signed)
Continue Breo and Singulair Will monitor

## 2018-05-02 NOTE — Assessment & Plan Note (Signed)
CMET and Lipid profile today Encouraged him to consume a low fat diet Continue Simvastatin, refilled today

## 2018-05-02 NOTE — Patient Instructions (Signed)

## 2018-05-02 NOTE — Progress Notes (Signed)
Subjective:    Patient ID: Nathan Conway, male    DOB: [birth year], 62 y.o.   MRN: [id number]  HPI  Pt presents to the clinic today for his annual exam. He is also due to follow up chronic conditions.  DM 2: His last A1C was 6.3%, 06/2017. He does not check his sugars. He is not taking any diabetic medication at this time. He tries to consume a low carb diet. He checks his feet daily. He gets an eye exam annually.  HTN: His BP today is 124/82. He is taking Losartan HCT as prescribed. ECG from 05/2015 reviewed.  HLD: His last LDL was 62, 06/2017. He is taking Simvastatin as prescribed. He denies myalgias. He consumes a low fat diet.  Asthma: Chronic but stable on Breo and Singulair. There is no spirometry on file.  He also reports intermittent right shoulder pain. This has been going on for months. He describes the pain as achy and sharp. The pain does not radiate. He denies numbness, tingling or weakness of the right arm. He denies any injury to the area. He does have trouble raising his right arm above his head. He takes Advil as needed with good relief.  Flu: 09/2017 Tetanus: 04/2014 Pneumovax: 04/2012 Prevnar: 05/2015 Zostovax: 12/2011  Shingrix: never PSA Screening: 09/2016 Colon Screening: 09/2015 Vision Screening: annually Dentist: biannually  Diet: He does eat meat. He consumes fruits and veggies daily. He tries to avoid fried foods. He drinks mostly water. Exercise: None  Review of Systems  Past Medical History:  Diagnosis Date  . Allergy   . Asthma   . Chicken pox   . Hyperlipidemia   . Hypertension   . Personal history of kidney stones    passed stones - no surgery    Current Outpatient Medications  Medication Sig Dispense Refill  . aspirin 81 MG tablet Take 81 mg by mouth daily.    . cholecalciferol (VITAMIN D) 1000 units tablet Take 1,000 Units by mouth daily.    . Fluticasone Furoate-Vilanterol (BREO ELLIPTA) 200-25 MCG/INH AEPB Inhale 2 puffs into the lungs  daily.    Marland Kitchen losartan-hydrochlorothiazide (HYZAAR) 100-12.5 MG tablet Take 1 tablet by mouth daily. 90 tablet 0  . montelukast (SINGULAIR) 10 MG tablet Take 10 mg by mouth daily as needed.     Marland Kitchen PROAIR HFA 108 (90 BASE) MCG/ACT inhaler Inhale 1-2 puffs into the lungs as needed.     . simvastatin (ZOCOR) 20 MG tablet Take 1 tablet (20 mg total) by mouth daily at 6 PM. 90 tablet 0   No current facility-administered medications for this visit.     No Known Allergies  Family History  Problem Relation Age of Onset  . Hypertension Mother   . Alcohol abuse Father   . Cancer Father        Lung  . Diabetes Neg Hx   . Heart disease Neg Hx   . Stroke Neg Hx   . Colon cancer Neg Hx   . Esophageal cancer Neg Hx   . Rectal cancer Neg Hx   . Stomach cancer Neg Hx     Social History   Socioeconomic History  . Marital status: Married    Spouse name: Not on file  . Number of children: Not on file  . Years of education: Not on file  . Highest education level: Not on file  Occupational History  . Not on file  Social Needs  . Financial resource strain: Not on file  .  Food insecurity:    Worry: Not on file    Inability: Not on file  . Transportation needs:    Medical: Not on file    Non-medical: Not on file  Tobacco Use  . Smoking status: Former Smoker    Packs/day: 2.00    Types: Cigarettes    Last attempt to quit: 11/05/1977    Years since quitting: 40.5  . Smokeless tobacco: Never Used  Substance and Sexual Activity  . Alcohol use: Yes    Alcohol/week: 1.2 oz    Types: 2 Shots of liquor per week    Comment: socially  . Drug use: No  . Sexual activity: Not Currently  Lifestyle  . Physical activity:    Days per week: Not on file    Minutes per session: Not on file  . Stress: Not on file  Relationships  . Social connections:    Talks on phone: Not on file    Gets together: Not on file    Attends religious service: Not on file    Active member of club or organization: Not on  file    Attends meetings of clubs or organizations: Not on file    Relationship status: Not on file  . Intimate partner violence:    Fear of current or ex partner: Not on file    Emotionally abused: Not on file    Physically abused: Not on file    Forced sexual activity: Not on file  Other Topics Concern  . Not on file  Social History Narrative  . Not on file     Constitutional: Denies fever, malaise, fatigue, headache or abrupt weight changes.  HEENT: Denies eye pain, eye redness, ear pain, ringing in the ears, wax buildup, runny nose, nasal congestion, bloody nose, or sore throat. Respiratory: Denies difficulty breathing, shortness of breath, cough or sputum production.   Cardiovascular: Denies chest pain, chest tightness, palpitations or swelling in the hands or feet.  Gastrointestinal: Pt reports intermittent reflux. Denies abdominal pain, bloating, constipation, diarrhea or blood in the stool.  GU: Pt reports intermittent urgency, hesitancy. Denies frequency, pain with urination, burning sensation, blood in urine, odor or discharge. Musculoskeletal: Pt reports intermittent right shoulder pain. Denies decrease in range of motion, difficulty with gait, muscle pain or joint swelling.  Skin: Denies redness, rashes, lesions or ulcercations.  Neurological: Denies dizziness, difficulty with memory, difficulty with speech or problems with balance and coordination.  Psych: Denies anxiety, depression, SI/HI.  No other specific complaints in a complete review of systems (except as listed in HPI above).     Objective:   Physical Exam   BP 124/82   Pulse 78   Temp 98.3 F (36.8 C) (Oral)   Ht 5\' 9"  (1.753 m)   Wt 189 lb (85.7 kg)   SpO2 98%   BMI 27.91 kg/m  Wt Readings from Last 3 Encounters:  05/02/18 189 lb (85.7 kg)  06/05/17 191 lb 8 oz (86.9 kg)  09/07/16 198 lb 12 oz (90.2 kg)    General: Appears his stated age, well developed, well nourished in NAD. Skin: Warm, dry  and intact. No ulcerations noted. HEENT: Head: normal shape and size; Eyes: sclera white, no icterus, conjunctiva pink, PERRLA and EOMs intact; Ears: Tm's gray and intact, normal light reflex;  Throat/Mouth: Teeth present, mucosa pink and moist, no exudate, lesions or ulcerations noted.  Neck:  Neck supple, trachea midline. No masses, lumps or thyromegaly present.  Cardiovascular: Normal rate and rhythm. S1,S2  noted.  No murmur, rubs or gallops noted. No JVD or BLE edema. No carotid bruits noted. Pulmonary/Chest: Normal effort and positive vesicular breath sounds. No respiratory distress. No wheezes, rales or ronchi noted.  Abdomen: Soft and nontender. Normal bowel sounds. No distention or masses noted. Liver, spleen and kidneys non palpable. Musculoskeletal: Normal internal and external rotation of the right shoulder. Negative drop can test. Strength 5/5 BUE/BLE. No difficulty with gait.  Neurological: Alert and oriented. Cranial nerves II-XII grossly intact. Coordination normal.  Psychiatric: Mood and affect normal. Behavior is normal. Judgment and thought content normal.     BMET    Component Value Date/Time   NA 136 06/05/2017 1433   K 4.1 06/05/2017 1433   CL 101 06/05/2017 1433   CO2 28 06/05/2017 1433   GLUCOSE 100 (H) 06/05/2017 1433   BUN 17 06/05/2017 1433   CREATININE 0.95 06/05/2017 1433   CALCIUM 9.6 06/05/2017 1433    Lipid Panel     Component Value Date/Time   CHOL 130 06/05/2017 1433   TRIG 102.0 06/05/2017 1433   HDL 47.20 06/05/2017 1433   CHOLHDL 3 06/05/2017 1433   VLDL 20.4 06/05/2017 1433   LDLCALC 62 06/05/2017 1433    CBC    Component Value Date/Time   WBC 9.4 09/07/2016 1439   RBC 4.33 09/07/2016 1439   HGB 14.3 09/07/2016 1439   HCT 42.7 09/07/2016 1439   PLT 226.0 09/07/2016 1439   MCV 98.5 09/07/2016 1439   MCHC 33.5 09/07/2016 1439   RDW 13.8 09/07/2016 1439   LYMPHSABS 2.6 05/18/2015 1613   MONOABS 0.7 05/18/2015 1613   EOSABS 0.3  05/18/2015 1613   BASOSABS 0.1 05/18/2015 1613    Hgb A1C Lab Results  Component Value Date   HGBA1C 6.3 06/05/2017           Assessment & Plan:   Preventative Health Maintenance:  Encouraged him to get a flu shot in the fall Tetanus, prevnar, zostovax UTD Pneumovax today He will check with insurance about Shingrix Colon screening UTD Encouraged him to consume a balanced diet and exercise regimen Advised him to see an eye doctor and dentist annually Will check CBC, CMET, Lipid, A1C and PSA  Today  RTC in 1 year, sooner if needed Webb Silversmith, NP

## 2018-05-02 NOTE — Assessment & Plan Note (Signed)
A1C today No microalbumin secondary to ARB therapy Encouraged him to consume a low carb diet Foot exam today Encouraged yearly eye exams Pneumovax today Prevnar UTD

## 2018-05-02 NOTE — Assessment & Plan Note (Addendum)
Controlled on Losartan HCT, refilled today Reinforced DASH diet CBC and CMET today

## 2018-05-03 LAB — LIPID PANEL
CHOL/HDL RATIO: 2.3 (calc) (ref <5.0)
CHOLESTEROL: 129 mg/dL (ref <200)
HDL: 55 mg/dL (ref >40)
LDL Cholesterol (Calc): 51 mg/dL (calc)
Non-HDL Cholesterol (Calc): 74 mg/dL (calc) (ref <130)
Triglycerides: 144 mg/dL (ref <150)

## 2018-05-03 LAB — CBC
HCT: 42.5 % (ref 38.5–50.0)
Hemoglobin: 14.9 g/dL (ref 13.2–17.1)
MCH: 33.9 pg — AB (ref 27.0–33.0)
MCHC: 35.1 g/dL (ref 32.0–36.0)
MCV: 96.8 fL (ref 80.0–100.0)
MPV: 10.4 fL (ref 7.5–12.5)
PLATELETS: 197 10*3/uL (ref 140–400)
RBC: 4.39 10*6/uL (ref 4.20–5.80)
RDW: 12.8 % (ref 11.0–15.0)
WBC: 10.2 10*3/uL (ref 3.8–10.8)

## 2018-05-03 LAB — COMPREHENSIVE METABOLIC PANEL
AG Ratio: 1.6 (calc) (ref 1.0–2.5)
ALBUMIN MSPROF: 4.5 g/dL (ref 3.6–5.1)
ALKALINE PHOSPHATASE (APISO): 67 U/L (ref 40–115)
ALT: 47 U/L — ABNORMAL HIGH (ref 9–46)
AST: 47 U/L — AB (ref 10–35)
BUN: 15 mg/dL (ref 7–25)
CALCIUM: 9.8 mg/dL (ref 8.6–10.3)
CHLORIDE: 101 mmol/L (ref 98–110)
CO2: 26 mmol/L (ref 20–32)
CREATININE: 0.94 mg/dL (ref 0.70–1.25)
GLOBULIN: 2.9 g/dL (ref 1.9–3.7)
GLUCOSE: 98 mg/dL (ref 65–99)
Potassium: 3.9 mmol/L (ref 3.5–5.3)
Sodium: 137 mmol/L (ref 135–146)
Total Bilirubin: 1 mg/dL (ref 0.2–1.2)
Total Protein: 7.4 g/dL (ref 6.1–8.1)

## 2018-05-03 LAB — HEMOGLOBIN A1C
EAG (MMOL/L): 6.8 (calc)
Hgb A1c MFr Bld: 5.9 % of total Hgb — ABNORMAL HIGH (ref <5.7)
Mean Plasma Glucose: 123 (calc)

## 2018-05-03 LAB — PSA: PSA: 1.4 ng/mL (ref <=4.0)

## 2018-05-05 NOTE — Addendum Note (Signed)
Addended by: Lurlean Nanny on: 05/05/2018 05:01 PM   Modules accepted: Orders

## 2018-09-09 ENCOUNTER — Encounter: Payer: Self-pay | Admitting: Gastroenterology

## 2018-11-09 ENCOUNTER — Encounter: Payer: Self-pay | Admitting: Gastroenterology

## 2019-04-27 ENCOUNTER — Other Ambulatory Visit: Payer: Self-pay | Admitting: Internal Medicine

## 2019-07-01 ENCOUNTER — Other Ambulatory Visit: Payer: Self-pay | Admitting: Internal Medicine

## 2019-08-07 ENCOUNTER — Encounter: Payer: Self-pay | Admitting: Internal Medicine

## 2019-08-07 ENCOUNTER — Other Ambulatory Visit: Payer: Self-pay

## 2019-08-07 ENCOUNTER — Ambulatory Visit (INDEPENDENT_AMBULATORY_CARE_PROVIDER_SITE_OTHER): Payer: 59 | Admitting: Internal Medicine

## 2019-08-07 VITALS — BP 124/80 | HR 69 | Temp 98.4°F | Ht 69.0 in | Wt 192.0 lb

## 2019-08-07 DIAGNOSIS — E7800 Pure hypercholesterolemia, unspecified: Secondary | ICD-10-CM | POA: Diagnosis not present

## 2019-08-07 DIAGNOSIS — Z0000 Encounter for general adult medical examination without abnormal findings: Secondary | ICD-10-CM

## 2019-08-07 DIAGNOSIS — J4530 Mild persistent asthma, uncomplicated: Secondary | ICD-10-CM

## 2019-08-07 DIAGNOSIS — I10 Essential (primary) hypertension: Secondary | ICD-10-CM | POA: Diagnosis not present

## 2019-08-07 DIAGNOSIS — Z125 Encounter for screening for malignant neoplasm of prostate: Secondary | ICD-10-CM | POA: Diagnosis not present

## 2019-08-07 DIAGNOSIS — E119 Type 2 diabetes mellitus without complications: Secondary | ICD-10-CM | POA: Diagnosis not present

## 2019-08-07 MED ORDER — ALBUTEROL SULFATE HFA 108 (90 BASE) MCG/ACT IN AERS: 1.0000 | INHALATION_SPRAY | RESPIRATORY_TRACT | 4 refills | Status: DC | PRN

## 2019-08-07 NOTE — Patient Instructions (Signed)

## 2019-08-07 NOTE — Progress Notes (Signed)
Subjective:    Patient ID: Nathan Conway, male    DOB: Jul 31, 1956, 63 y.o.   MRN: [id number]  HPI  Pt presents to the clinic today for his annual exam. He is also due to follow up chronic conditions.  DM 2: His last A1C was 5.9%, 03/2018. He is not taking any oral diabetic medication at this time. He is not checking his sugars. He checks his feet routinely.   HTN: His BP today is 124/80. He is taking Losartan HCT as prescribed. ECG from 05/2015 reviewed.  HLD: His last LDL was 51, 04/2018. He denies myalgias on Simvastatin.  He does not consume a low-fat diet.  Asthma: Mildpersistent. Managed on Breo taking prn instead of daily) and Albuterol. There are no PFT's on file. He is not following with a pulmonologist.   Flu: 09/2018 Tetanus: 04/2014 Pneumovax: 04/2018 Prevnar: 05/2015 Zostovax: 12/2011 Shingrix: never PSA Screening: 04/2018 Colon Screening: 09/2015 Vision Screening: every 2 years Dentist: biannually  Diet: Eats meat, fruits, vegetables, and fried stuff. Drinks water and the very occasional soda. Exercise: None  Review of Systems  Past Medical History:  Diagnosis Date  . Adenomatous colon polyp   . Allergy   . Asthma   . Chicken pox   . Diverticulosis   . Hyperlipidemia   . Hypertension   . Personal history of kidney stones    passed stones - no surgery    Current Outpatient Medications  Medication Sig Dispense Refill  . aspirin 81 MG tablet Take 81 mg by mouth daily.    . cholecalciferol (VITAMIN D) 1000 units tablet Take 1,000 Units by mouth daily.    . Fluticasone Furoate-Vilanterol (BREO ELLIPTA) 200-25 MCG/INH AEPB Inhale 2 puffs into the lungs daily.    Marland Kitchen losartan-hydrochlorothiazide (HYZAAR) 100-12.5 MG tablet Take 1 tablet by mouth daily. MUST SCHEDULE PHYSICAL EXAM 90 tablet 0  . montelukast (SINGULAIR) 10 MG tablet Take 10 mg by mouth daily as needed.     Marland Kitchen PROAIR HFA 108 (90 BASE) MCG/ACT inhaler Inhale 1-2 puffs into the lungs as needed.     .  simvastatin (ZOCOR) 20 MG tablet Take 1 tablet (20 mg total) by mouth daily at 6 PM. MUST SCHEDULE PHYSICAL 90 tablet 0   No current facility-administered medications for this visit.     No Known Allergies  Family History  Problem Relation Age of Onset  . Hypertension Mother   . Alcohol abuse Father   . Lung cancer Father   . Diabetes Neg Hx   . Heart disease Neg Hx   . Stroke Neg Hx   . Colon cancer Neg Hx   . Esophageal cancer Neg Hx   . Rectal cancer Neg Hx   . Stomach cancer Neg Hx     Social History   Socioeconomic History  . Marital status: Married    Spouse name: Not on file  . Number of children: Not on file  . Years of education: Not on file  . Highest education level: Not on file  Occupational History  . Not on file  Social Needs  . Financial resource strain: Not on file  . Food insecurity    Worry: Not on file    Inability: Not on file  . Transportation needs    Medical: Not on file    Non-medical: Not on file  Tobacco Use  . Smoking status: Former Smoker    Packs/day: 2.00    Types: Cigarettes    Quit date: 11/05/1977  Years since quitting: 41.7  . Smokeless tobacco: Never Used  Substance and Sexual Activity  . Alcohol use: Yes    Alcohol/week: 2.0 standard drinks    Types: 2 Shots of liquor per week    Comment: socially  . Drug use: No  . Sexual activity: Not Currently  Lifestyle  . Physical activity    Days per week: Not on file    Minutes per session: Not on file  . Stress: Not on file  Relationships  . Social Herbalist on phone: Not on file    Gets together: Not on file    Attends religious service: Not on file    Active member of club or organization: Not on file    Attends meetings of clubs or organizations: Not on file    Relationship status: Not on file  . Intimate partner violence    Fear of current or ex partner: Not on file    Emotionally abused: Not on file    Physically abused: Not on file    Forced sexual  activity: Not on file  Other Topics Concern  . Not on file  Social History Narrative  . Not on file     Constitutional: Denies fever, malaise, fatigue, headache or abrupt weight changes.  HEENT: Denies eye pain, eye redness, ear pain, ringing in the ears, wax buildup, runny nose, nasal congestion, bloody nose, or sore throat. Respiratory: Patient reports intermittent cough and shortness of breath. Denies difficulty breathing, or sputum production.   Cardiovascular: Denies chest pain, chest tightness, palpitations or swelling in the hands or feet.  Gastrointestinal: Denies abdominal pain, bloating, constipation, diarrhea or blood in the stool.  GU: Denies urgency, frequency, pain with urination, burning sensation, blood in urine, odor or discharge. Musculoskeletal: Denies decrease in range of motion, difficulty with gait, muscle pain or joint pain and swelling.  Skin: Denies redness, rashes, lesions or ulcercations.  Neurological: Denies dizziness, difficulty with memory, difficulty with speech or problems with balance and coordination.  Psych: Denies anxiety, depression, SI/HI.  No other specific complaints in a complete review of systems (except as listed in HPI above).     Objective:   Physical Exam  BP 124/80   Pulse 69   Temp 98.4 F (36.9 C) (Temporal)   Ht 5\' 9"  (1.753 m)   Wt 192 lb (87.1 kg)   SpO2 98%   BMI 28.35 kg/m  Wt Readings from Last 3 Encounters:  08/07/19 192 lb (87.1 kg)  05/02/18 189 lb (85.7 kg)  06/05/17 191 lb 8 oz (86.9 kg)    General: Appears his stated age, well developed, well nourished in NAD. Skin: Warm, dry and intact. No rashes ulcerations noted. HEENT: Head: normal shape and size; Eyes: sclera white, no icterus, conjunctiva pink, PERRLA and EOMs intact; Ears: Tm's gray and intact, normal light reflex;  Neck:  Neck supple, trachea midline. No masses, lumps or thyromegaly present.  Cardiovascular: Normal rate and rhythm. S1,S2 noted.  No  murmur, rubs or gallops noted. No JVD or BLE edema. No carotid bruits noted. Pulmonary/Chest: Normal effort and positive vesicular breath sounds. No respiratory distress. No wheezes, rales or ronchi noted.  Abdomen: Soft and nontender. Normal bowel sounds. No distention or masses noted. Liver, spleen and kidneys non palpable. Musculoskeletal: Strength 5/5 BUE/BLE. No difficulty with gait.  Neurological: Alert and oriented. Cranial nerves II-XII grossly intact. Coordination normal.  Psychiatric: Mood and affect normal. Behavior is normal. Judgment and thought content normal.  EKG:  BMET    Component Value Date/Time   NA 141 08/07/2019 1509   K 4.3 08/07/2019 1509   CL 105 08/07/2019 1509   CO2 27 08/07/2019 1509   GLUCOSE 115 (H) 08/07/2019 1509   BUN 21 08/07/2019 1509   CREATININE 0.96 08/07/2019 1509   CALCIUM 10.1 08/07/2019 1509    Lipid Panel     Component Value Date/Time   CHOL 132 08/07/2019 1509   TRIG 76 08/07/2019 1509   HDL 50 08/07/2019 1509   CHOLHDL 2.6 08/07/2019 1509   VLDL 20.4 06/05/2017 1433   LDLCALC 66 08/07/2019 1509    CBC    Component Value Date/Time   WBC 10.6 08/07/2019 1509   RBC 4.70 08/07/2019 1509   HGB 14.3 08/07/2019 1509   HCT 42.4 08/07/2019 1509   PLT 204 08/07/2019 1509   MCV 90.2 08/07/2019 1509   MCH 30.4 08/07/2019 1509   MCHC 33.7 08/07/2019 1509   RDW 11.8 08/07/2019 1509   LYMPHSABS 2.6 05/18/2015 1613   MONOABS 0.7 05/18/2015 1613   EOSABS 0.3 05/18/2015 1613   BASOSABS 0.1 05/18/2015 1613    Hgb A1C Lab Results  Component Value Date   HGBA1C 6.6 (H) 08/07/2019            Assessment & Plan:   Preventative Health Maintenance:  Flu shot today Tetanus, penumovax, prevanr and zostovax UTD He declines shingrix vaccine Colon screening UTD Encouraged him to consume a balanced diet and exercise regimen Advised to see an eye doctor and dentist annually Will check CBC, CMET, Lipid, A1C and PSA today today   RTC in 6 months, follow up chronic conditoins Webb Silversmith, NP

## 2019-08-08 ENCOUNTER — Encounter: Payer: Self-pay | Admitting: Internal Medicine

## 2019-08-08 LAB — COMPREHENSIVE METABOLIC PANEL
AG Ratio: 1.5 (calc) (ref 1.0–2.5)
ALT: 28 U/L (ref 9–46)
AST: 26 U/L (ref 10–35)
Albumin: 4.4 g/dL (ref 3.6–5.1)
Alkaline phosphatase (APISO): 66 U/L (ref 35–144)
BUN: 21 mg/dL (ref 7–25)
CO2: 27 mmol/L (ref 20–32)
Calcium: 10.1 mg/dL (ref 8.6–10.3)
Chloride: 105 mmol/L (ref 98–110)
Creat: 0.96 mg/dL (ref 0.70–1.25)
Globulin: 2.9 g/dL (calc) (ref 1.9–3.7)
Glucose, Bld: 115 mg/dL — ABNORMAL HIGH (ref 65–99)
Potassium: 4.3 mmol/L (ref 3.5–5.3)
Sodium: 141 mmol/L (ref 135–146)
Total Bilirubin: 0.6 mg/dL (ref 0.2–1.2)
Total Protein: 7.3 g/dL (ref 6.1–8.1)

## 2019-08-08 LAB — CBC
HCT: 42.4 % (ref 38.5–50.0)
Hemoglobin: 14.3 g/dL (ref 13.2–17.1)
MCH: 30.4 pg (ref 27.0–33.0)
MCHC: 33.7 g/dL (ref 32.0–36.0)
MCV: 90.2 fL (ref 80.0–100.0)
MPV: 11.4 fL (ref 7.5–12.5)
Platelets: 204 10*3/uL (ref 140–400)
RBC: 4.7 10*6/uL (ref 4.20–5.80)
RDW: 11.8 % (ref 11.0–15.0)
WBC: 10.6 10*3/uL (ref 3.8–10.8)

## 2019-08-08 LAB — HEMOGLOBIN A1C
Hgb A1c MFr Bld: 6.6 % of total Hgb — ABNORMAL HIGH (ref <5.7)
Mean Plasma Glucose: 143 (calc)
eAG (mmol/L): 7.9 (calc)

## 2019-08-08 LAB — LIPID PANEL
Cholesterol: 132 mg/dL (ref <200)
HDL: 50 mg/dL (ref >=40)
LDL Cholesterol (Calc): 66 mg/dL (calc)
Non-HDL Cholesterol (Calc): 82 mg/dL (calc) (ref <130)
Total CHOL/HDL Ratio: 2.6 (calc) (ref <5.0)
Triglycerides: 76 mg/dL (ref <150)

## 2019-08-08 LAB — PSA: PSA: 1.5 ng/mL (ref <=4.0)

## 2019-08-08 NOTE — Assessment & Plan Note (Signed)
Encouraged use of BREO daily and Albuterol prn Will monitor

## 2019-08-08 NOTE — Assessment & Plan Note (Signed)
CMET and Lipid profile today Continue Simvastatin Encouraged him to consume a low fat diet

## 2019-08-08 NOTE — Assessment & Plan Note (Signed)
A1C today No urine micoralbumin secondary to ARB therapy Encouraged him to consume a low carb diet and exercise for weight loss No medications at this time Encouraged yearly eye exam Foot exam today Flu and pneumonia vaccines UTD

## 2019-08-08 NOTE — Assessment & Plan Note (Signed)
Controlled on Losartan  HCT C met today Reinforced DASH diet and exercise for weight loss Will monitro

## 2019-08-14 MED ORDER — SIMVASTATIN 20 MG PO TABS: 20.0000 mg | ORAL_TABLET | Freq: Every day | ORAL | 2 refills | Status: DC

## 2019-08-14 MED ORDER — LOSARTAN POTASSIUM-HCTZ 100-12.5 MG PO TABS: 1.0000 | ORAL_TABLET | Freq: Every day | ORAL | 2 refills | Status: DC

## 2019-08-14 NOTE — Addendum Note (Signed)
Addended by: Lurlean Nanny on: 08/14/2019 12:27 PM   Modules accepted: Orders

## 2019-08-25 ENCOUNTER — Emergency Department: Payer: 59

## 2019-08-25 ENCOUNTER — Emergency Department
Admission: EM | Admit: 2019-08-25 | Discharge: 2019-08-25 | Disposition: A | Discharge status: Home / Self Care | Payer: 59 | Attending: Emergency Medicine | Admitting: Emergency Medicine

## 2019-08-25 ENCOUNTER — Other Ambulatory Visit: Payer: Self-pay

## 2019-08-25 DIAGNOSIS — J45909 Unspecified asthma, uncomplicated: Secondary | ICD-10-CM | POA: Insufficient documentation

## 2019-08-25 DIAGNOSIS — I10 Essential (primary) hypertension: Secondary | ICD-10-CM | POA: Insufficient documentation

## 2019-08-25 DIAGNOSIS — E119 Type 2 diabetes mellitus without complications: Secondary | ICD-10-CM | POA: Insufficient documentation

## 2019-08-25 DIAGNOSIS — Z79899 Other long term (current) drug therapy: Secondary | ICD-10-CM | POA: Diagnosis not present

## 2019-08-25 DIAGNOSIS — Z7982 Long term (current) use of aspirin: Secondary | ICD-10-CM | POA: Diagnosis not present

## 2019-08-25 DIAGNOSIS — R0789 Other chest pain: Secondary | ICD-10-CM | POA: Diagnosis not present

## 2019-08-25 DIAGNOSIS — Z87891 Personal history of nicotine dependence: Secondary | ICD-10-CM | POA: Diagnosis not present

## 2019-08-25 DIAGNOSIS — R079 Chest pain, unspecified: Secondary | ICD-10-CM | POA: Diagnosis present

## 2019-08-25 LAB — HEPATIC FUNCTION PANEL
ALT: 30 U/L (ref 0–44)
AST: 31 U/L (ref 15–41)
Albumin: 4.1 g/dL (ref 3.5–5.0)
Alkaline Phosphatase: 60 U/L (ref 38–126)
Bilirubin, Direct: 0.1 mg/dL (ref 0.0–0.2)
Indirect Bilirubin: 0.9 mg/dL (ref 0.3–0.9)
Total Bilirubin: 1 mg/dL (ref 0.3–1.2)
Total Protein: 7.7 g/dL (ref 6.5–8.1)

## 2019-08-25 LAB — BASIC METABOLIC PANEL
Anion gap: 13 (ref 5–15)
BUN: 20 mg/dL (ref 8–23)
CO2: 23 mmol/L (ref 22–32)
Calcium: 9.1 mg/dL (ref 8.9–10.3)
Chloride: 99 mmol/L (ref 98–111)
Creatinine, Ser: 0.81 mg/dL (ref 0.61–1.24)
GFR calc Af Amer: >60 mL/min (ref >60)
GFR calc non Af Amer: >60 mL/min (ref >60)
Glucose, Bld: 110 mg/dL — ABNORMAL HIGH (ref 70–99)
Potassium: 3.6 mmol/L (ref 3.5–5.1)
Sodium: 135 mmol/L (ref 135–145)

## 2019-08-25 LAB — CBC
HCT: 38.6 % — ABNORMAL LOW (ref 39.0–52.0)
Hemoglobin: 13.2 g/dL (ref 13.0–17.0)
MCH: 31.1 pg (ref 26.0–34.0)
MCHC: 34.2 g/dL (ref 30.0–36.0)
MCV: 90.8 fL (ref 80.0–100.0)
Platelets: 169 10*3/uL (ref 150–400)
RBC: 4.25 MIL/uL (ref 4.22–5.81)
RDW: 12.9 % (ref 11.5–15.5)
WBC: 9.6 10*3/uL (ref 4.0–10.5)
nRBC: 0 % (ref 0.0–0.2)

## 2019-08-25 LAB — TROPONIN I (HIGH SENSITIVITY): Troponin I (High Sensitivity): 3 ng/L (ref <18)

## 2019-08-25 LAB — LIPASE, BLOOD: Lipase: 18 U/L (ref 11–51)

## 2019-08-25 MED ORDER — TRAMADOL HCL 50 MG PO TABS: 50.0000 mg | ORAL_TABLET | Freq: Four times a day (QID) | ORAL | 0 refills | Status: DC | PRN

## 2019-08-25 NOTE — ED Notes (Signed)
Lab called about lab work.

## 2019-08-25 NOTE — ED Provider Notes (Signed)
[facility] Emergency Department Provider Note  Time seen: 3:27 PM  I have reviewed the triage vital signs and the nursing notes.   HISTORY  Chief Complaint Chest Pain   HPI Nathan Conway is a 63 y.o. male with a past medical history of asthma, hypertension, hyperlipidemia, presents emergency department for left chest pain.  According to the patient over the past 2 days or so he has been experiencing pain in the left chest worse with movements or deep inspiration.  Patient states he has been doing a lot of heavy lifting at work over the past several weeks.  Denies any shortness of breath nausea or diaphoresis.  No cough or fever.  No leg pain or swelling.  States the pain is worse if he twists the chest or takes a deep breath.   Past Medical History:  Diagnosis Date  . Adenomatous colon polyp   . Allergy   . Asthma   . Chicken pox   . Diverticulosis   . Hyperlipidemia   . Hypertension   . Personal history of kidney stones    passed stones - no surgery    Patient Active Problem List   Diagnosis Date Noted  . Type 2 diabetes mellitus without complication, without long-term current use of insulin ([place]) 06/05/2017  . Hepatic steatosis 12/16/2015  . Asthma, mild persistent 09/17/2014  . HLD (hyperlipidemia) 09/17/2014  . HTN (hypertension) 09/17/2014    Past Surgical History:  Procedure Laterality Date  . COLONOSCOPY  2008   In [place] - polyp  . HERNIA REPAIR    . NASAL SINUS SURGERY    . WISDOM TOOTH EXTRACTION      Prior to Admission medications   Medication Sig Start Date End Date Taking? Authorizing Provider  albuterol (PROAIR HFA) 108 (90 Base) MCG/ACT inhaler Inhale 1-2 puffs into the lungs as needed. 08/07/19   Jearld Fenton, NP  aspirin 81 MG tablet Take 81 mg by mouth daily.    [provider]  cholecalciferol (VITAMIN D) 1000 units tablet Take 1,000 Units by mouth daily.    [provider]  Fluticasone  Furoate-Vilanterol (BREO ELLIPTA) 200-25 MCG/INH AEPB Inhale 2 puffs into the lungs daily.    [provider]  losartan-hydrochlorothiazide (HYZAAR) 100-12.5 MG tablet Take 1 tablet by mouth daily. 08/14/19   Jearld Fenton, NP  simvastatin (ZOCOR) 20 MG tablet Take 1 tablet (20 mg total) by mouth daily at 6 PM. 08/14/19   Jearld Fenton, NP    No Known Allergies  Family History  Problem Relation Age of Onset  . Hypertension Mother   . Alcohol abuse Father   . Lung cancer Father   . Diabetes Neg Hx   . Heart disease Neg Hx   . Stroke Neg Hx   . Colon cancer Neg Hx   . Esophageal cancer Neg Hx   . Rectal cancer Neg Hx   . Stomach cancer Neg Hx     Social History Social History   Tobacco Use  . Smoking status: Former Smoker    Packs/day: 2.00    Types: Cigarettes    Quit date: 11/05/1977    Years since quitting: 41.8  . Smokeless tobacco: Never Used  Substance Use Topics  . Alcohol use: Yes    Alcohol/week: 2.0 standard drinks    Types: 2 Shots of liquor per week    Comment: socially  . Drug use: No    Review of Systems Constitutional: Negative for  fever. Cardiovascular: Left chest wall pain Respiratory: Negative for shortness of breath.  Negative for cough Gastrointestinal: Negative for abdominal pain Musculoskeletal: Negative for leg pain or swelling Skin: Negative for skin complaints  Neurological: Negative for headache All other ROS negative  ____________________________________________   PHYSICAL EXAM:  VITAL SIGNS: ED Triage Vitals [08/25/19 1330]  Enc Vitals Group     BP 128/77     Pulse Rate 75     Resp 18     Temp 98.4 F (36.9 C)     Temp Source Oral     SpO2 98 %     Weight 195 lb (88.5 kg)     Height 5\' 9"  (1.753 m)     Head Circumference      Peak Flow      Pain Score 9     Pain Loc      Pain Edu?      Excl. in [place]?    Constitutional: Alert and oriented. Well appearing and in no distress. Eyes: Normal exam ENT      Head:  Normocephalic and atraumatic.      Mouth/Throat: Mucous membranes are moist. Cardiovascular: Normal rate, regular rhythm. No murmur Respiratory: Normal respiratory effort without tachypnea nor retractions. Breath sounds are clear.  Patient does have moderate tenderness palpation of the left lateral lower ribs. Gastrointestinal: Soft and nontender. No distention.  Musculoskeletal: Nontender with normal range of motion in all extremities. No lower extremity tenderness or edema. Neurologic:  Normal speech and language. No gross focal neurologic deficits are appreciated. Skin:  Skin is warm, dry and intact.  Psychiatric: Mood and affect are normal. Speech and behavior are normal.   ____________________________________________    EKG  EKG viewed and interpreted by myself shows a normal sinus rhythm at 76 bpm with a narrow QRS, normal axis, normal intervals, no concerning ST changes.  ____________________________________________    RADIOLOGY  X-ray negative  ____________________________________________   INITIAL IMPRESSION / ASSESSMENT AND PLAN / ED COURSE  Pertinent labs & imaging results that were available during my care of the patient were reviewed by me and considered in my medical decision making (see chart for details).   Patient presents emergency department for left chest pain, worse with deep inspiration or movement.  Patient's chest pain is reproducible on exam.  Patient's work-up is reassuring including negative troponin.  Normal chest x-ray and reassuring EKG.  Highly suspect chest wall pain/musculoskeletal pain.  I did discuss the very remote possibility of a PE.  No history of blood clots, no leg pain or swelling, normal heart rate with normal O2 saturation.  Offered CT patient does not wish to proceed with CT imaging at this time and states he will follow-up with his doctor if symptoms worsen.  Overall patient appears well and I believe he is safe for discharge home.  We will  place the patient on Ultram and I recommended using scheduled Tylenol for the next few days.  [name] was evaluated in Emergency Department on 08/25/2019 for the symptoms described in the history of present illness. He was evaluated in the context of the global COVID-19 pandemic, which necessitated consideration that the patient might be at risk for infection with the SARS-CoV-2 virus that causes COVID-19. Institutional protocols and algorithms that pertain to the evaluation of patients at risk for COVID-19 are in a state of rapid change based on information released by regulatory bodies including the CDC and federal and state organizations. These policies and algorithms were  followed during the patient's care in the ED.  ____________________________________________   FINAL CLINICAL IMPRESSION(S) / ED DIAGNOSES  Chest wall pain   Harvest Dark, MD 08/25/19 1531

## 2019-08-25 NOTE — ED Triage Notes (Signed)
Patient reports left-sided chest pain since yesterday. Reports mild associated SOB. Patient states pain is worse with deep inspiration. Patient denies any known injury or cardiac history.

## 2019-12-24 ENCOUNTER — Encounter: Payer: Self-pay | Admitting: Internal Medicine

## 2020-02-18 ENCOUNTER — Other Ambulatory Visit: Payer: Self-pay | Admitting: Internal Medicine

## 2020-06-06 ENCOUNTER — Encounter: Payer: Self-pay | Admitting: Gastroenterology

## 2020-06-19 IMAGING — CR DG CHEST 2V
1 series · 2 of 2 positions shown · non-contrast
Comparison: None.

CLINICAL DATA: Left-sided chest pain

EXAM:
CHEST - 2 VIEW

[Series 1: dg chest 2 view · 0.14mm/px · 2 of 2 slices shown]
[im 1/2]
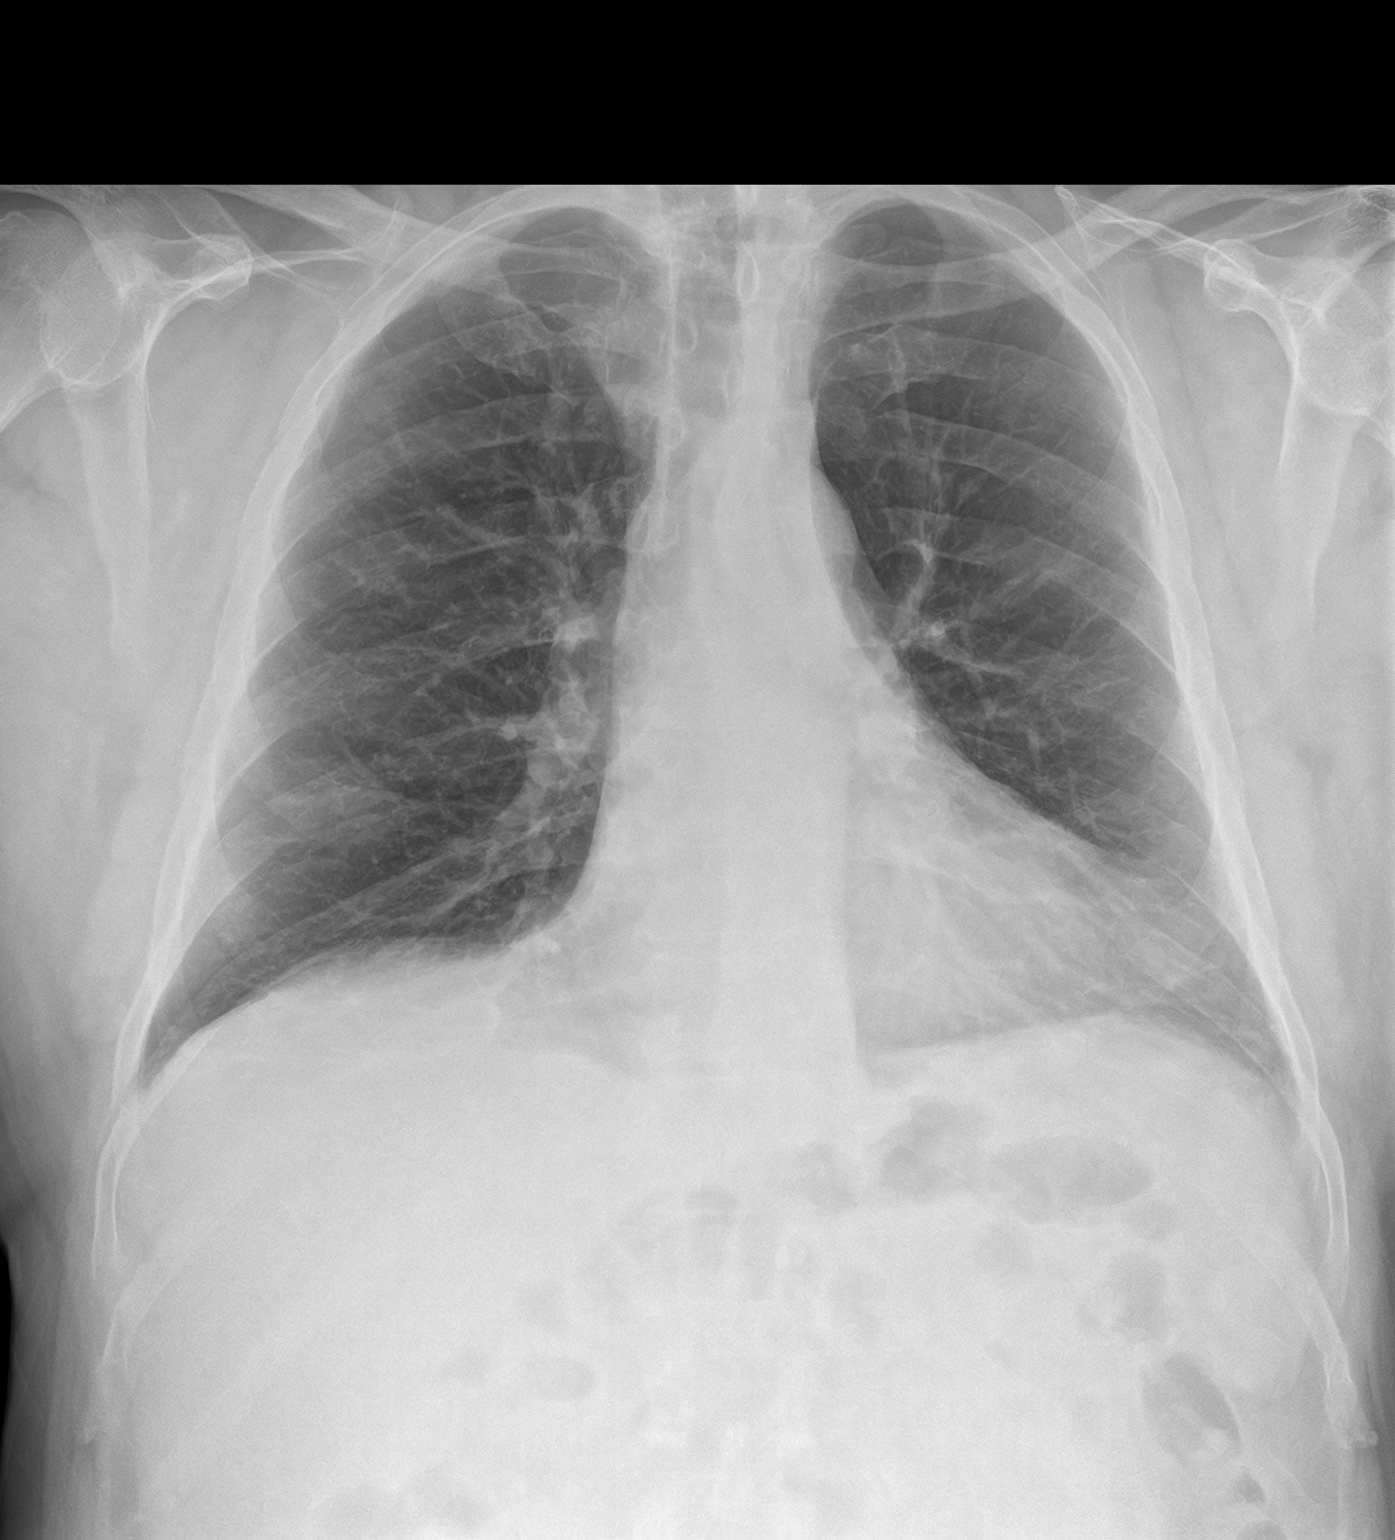
[im 2/2]
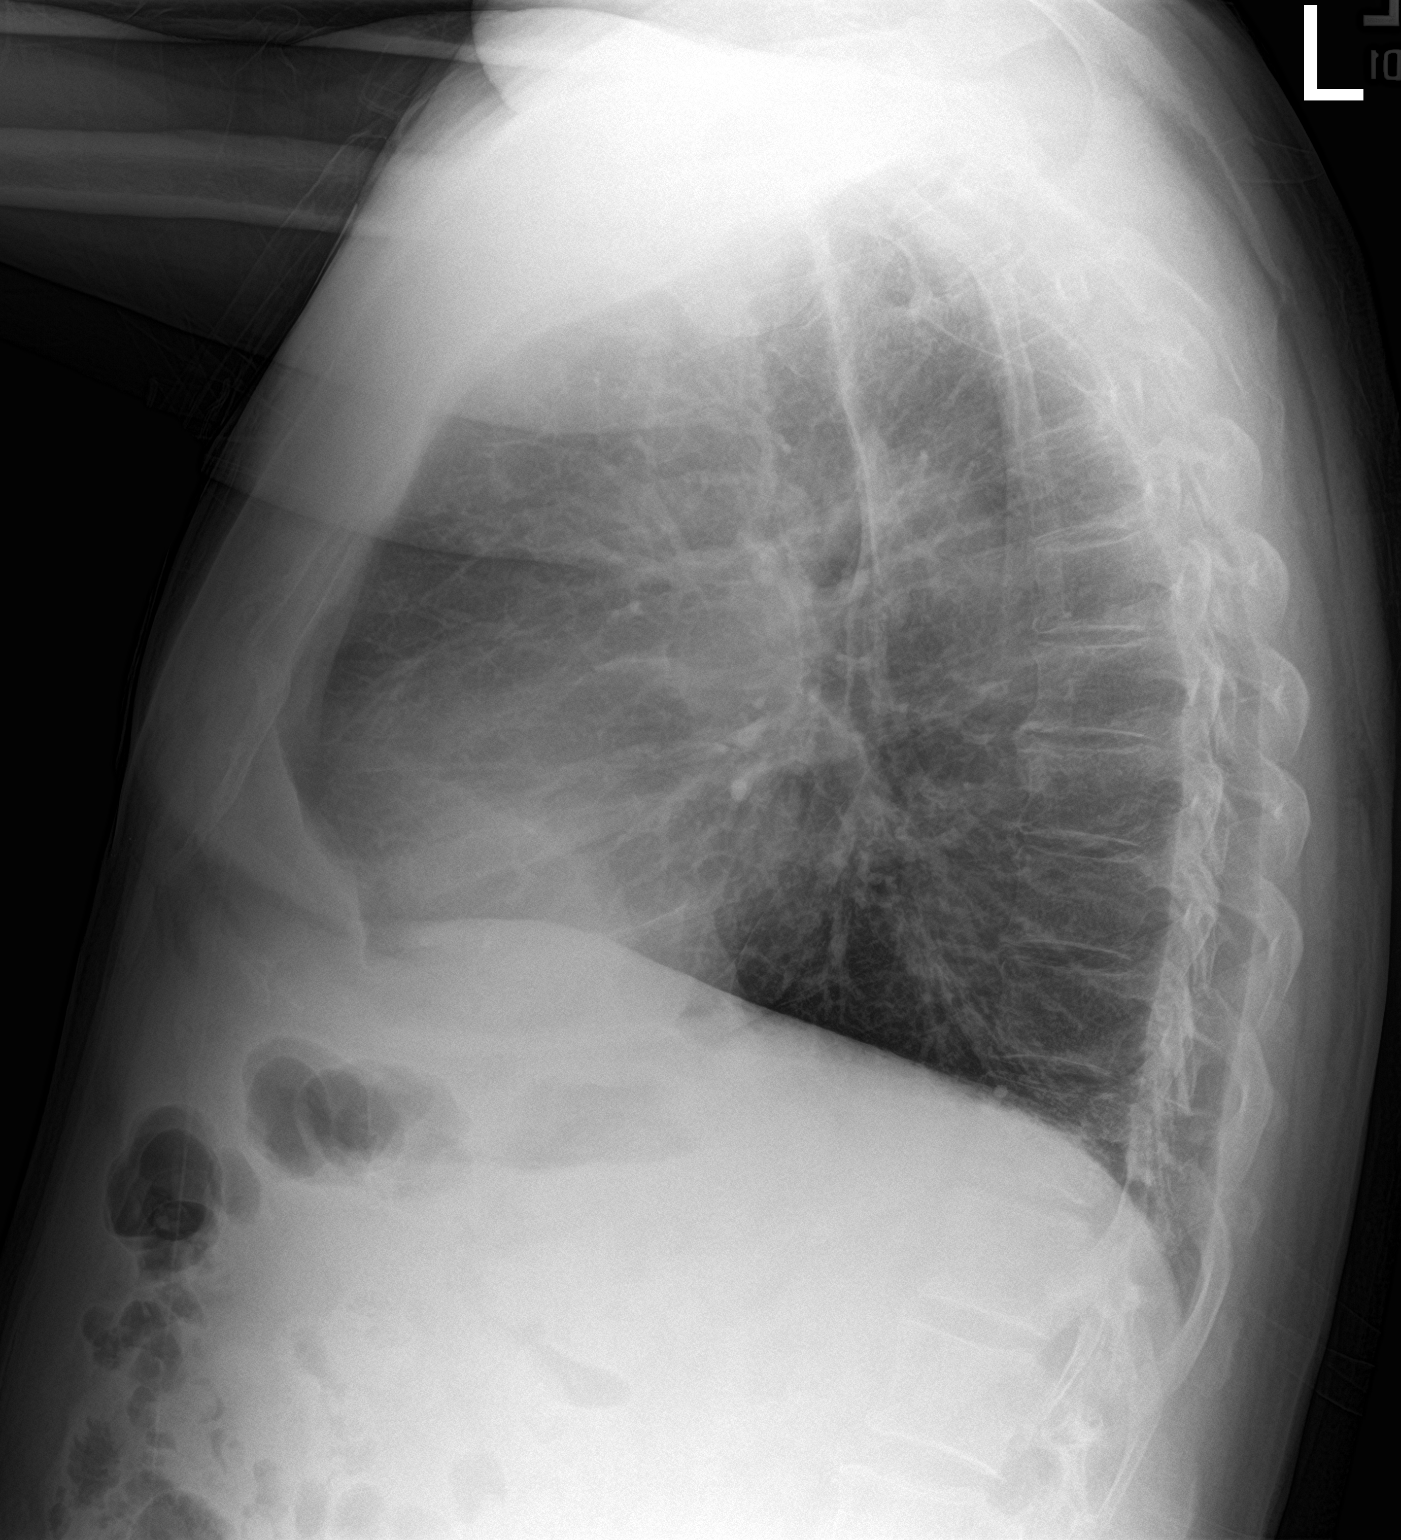

[2 of 2 positions shown; findings below may reference images not displayed]

FINDINGS: The heart size and mediastinal contours are within normal limits.
Both lungs are clear. The visualized skeletal structures are
unremarkable.
IMPRESSION: No active cardiopulmonary disease.

## 2020-06-27 ENCOUNTER — Encounter: Payer: Self-pay | Admitting: Internal Medicine

## 2020-06-27 ENCOUNTER — Telehealth (INDEPENDENT_AMBULATORY_CARE_PROVIDER_SITE_OTHER): Payer: 59 | Admitting: Internal Medicine

## 2020-06-27 DIAGNOSIS — R197 Diarrhea, unspecified: Secondary | ICD-10-CM

## 2020-06-27 DIAGNOSIS — R0981 Nasal congestion: Secondary | ICD-10-CM

## 2020-06-27 DIAGNOSIS — R05 Cough: Secondary | ICD-10-CM

## 2020-06-27 DIAGNOSIS — R519 Headache, unspecified: Secondary | ICD-10-CM

## 2020-06-27 DIAGNOSIS — R112 Nausea with vomiting, unspecified: Secondary | ICD-10-CM

## 2020-06-27 DIAGNOSIS — R5383 Other fatigue: Secondary | ICD-10-CM

## 2020-06-27 DIAGNOSIS — R42 Dizziness and giddiness: Secondary | ICD-10-CM

## 2020-06-27 DIAGNOSIS — R0602 Shortness of breath: Secondary | ICD-10-CM

## 2020-06-27 DIAGNOSIS — R059 Cough, unspecified: Secondary | ICD-10-CM

## 2020-06-27 MED ORDER — ALBUTEROL SULFATE HFA 108 (90 BASE) MCG/ACT IN AERS: 2.0000 | INHALATION_SPRAY | Freq: Four times a day (QID) | RESPIRATORY_TRACT | 0 refills | Status: DC | PRN

## 2020-06-27 MED ORDER — PREDNISONE 10 MG PO TABS: ORAL_TABLET | ORAL | 0 refills | Status: DC

## 2020-06-27 MED ORDER — HYDROCODONE-HOMATROPINE 5-1.5 MG/5ML PO SYRP: 5.0000 mL | ORAL_SOLUTION | Freq: Three times a day (TID) | ORAL | 0 refills | Status: DC | PRN

## 2020-06-27 MED ORDER — ONDANSETRON HCL 4 MG PO TABS: 4.0000 mg | ORAL_TABLET | Freq: Three times a day (TID) | ORAL | 0 refills | Status: DC | PRN

## 2020-06-27 NOTE — Progress Notes (Signed)
Virtual Visit via Video Note  I connected with Nathan Conway on 06/27/20 at  2:15 PM EDT by a video enabled telemedicine application and verified that I am speaking with the correct person using two identifiers.  Location: Patient: Home Provider: Office  Persons participating in this video visit: Webb Silversmith, NP and Berniece Andreas   I discussed the limitations of evaluation and management by telemedicine and the availability of in person appointments. The patient expressed understanding and agreed to proceed.  History of Present Illness:  Pt reports headache, nasal congestion, cough, shortness of breath, nausea, vomiting, diarrhea. His headache is located in the left side of his head. He describes the pain as throbbing. He reports associated dizziness but denies visual changes, sensitivity to light or sound. He is blowing hard yellow mucous out of his nose. He has tried Designer, television/film set. He has not had sick contacts or exposure to Covid that he is aware of. He has had his Covid vaccine.   Past Medical History:  Diagnosis Date  . Adenomatous colon polyp   . Allergy   . Asthma   . Chicken pox   . Diverticulosis   . Hyperlipidemia   . Hypertension   . Personal history of kidney stones    passed stones - no surgery    Current Outpatient Medications  Medication Sig Dispense Refill  . albuterol (PROAIR HFA) 108 (90 Base) MCG/ACT inhaler Inhale 1-2 puffs into the lungs as needed. 18 g 4  . aspirin 81 MG tablet Take 81 mg by mouth daily.    . cholecalciferol (VITAMIN D) 1000 units tablet Take 1,000 Units by mouth daily.    . Fluticasone Furoate-Vilanterol (BREO ELLIPTA) 200-25 MCG/INH AEPB Inhale 2 puffs into the lungs daily.    Marland Kitchen losartan-hydrochlorothiazide (HYZAAR) 100-12.5 MG tablet Take 1 tablet by mouth daily. 90 tablet 2  . simvastatin (ZOCOR) 20 MG tablet TAKE 1 TABLET BY MOUTH ONCE A DAY AT SIXIN THE EVENING 90 tablet 0  . traMADol (ULTRAM) 50 MG tablet Take 1 tablet (50  mg total) by mouth every 6 (six) hours as needed. 20 tablet 0   No current facility-administered medications for this visit.    No Known Allergies  Family History  Problem Relation Age of Onset  . Hypertension Mother   . Alcohol abuse Father   . Lung cancer Father   . Diabetes Neg Hx   . Heart disease Neg Hx   . Stroke Neg Hx   . Colon cancer Neg Hx   . Esophageal cancer Neg Hx   . Rectal cancer Neg Hx   . Stomach cancer Neg Hx     Social History   Socioeconomic History  . Marital status: Married    Spouse name: Not on file  . Number of children: Not on file  . Years of education: Not on file  . Highest education level: Not on file  Occupational History  . Not on file  Tobacco Use  . Smoking status: Former Smoker    Packs/day: 2.00    Types: Cigarettes    Quit date: 11/05/1977    Years since quitting: 42.6  . Smokeless tobacco: Never Used  Substance and Sexual Activity  . Alcohol use: Yes    Alcohol/week: 2.0 standard drinks    Types: 2 Shots of liquor per week    Comment: socially  . Drug use: No  . Sexual activity: Not Currently  Other Topics Concern  . Not on file  Social  History Narrative  . Not on file   Social Determinants of Health   Financial Resource Strain:   . Difficulty of Paying Living Expenses: Not on file  Food Insecurity:   . Worried About Charity fundraiser in the Last Year: Not on file  . Ran Out of Food in the Last Year: Not on file  Transportation Needs:   . Lack of Transportation (Medical): Not on file  . Lack of Transportation (Non-Medical): Not on file  Physical Activity:   . Days of Exercise per Week: Not on file  . Minutes of Exercise per Session: Not on file  Stress:   . Feeling of Stress : Not on file  Social Connections:   . Frequency of Communication with Friends and Family: Not on file  . Frequency of Social Gatherings with Friends and Family: Not on file  . Attends Religious Services: Not on file  . Active Member of  Clubs or Organizations: Not on file  . Attends Archivist Meetings: Not on file  . Marital Status: Not on file  Intimate Partner Violence:   . Fear of Current or Ex-Partner: Not on file  . Emotionally Abused: Not on file  . Physically Abused: Not on file  . Sexually Abused: Not on file     Constitutional: Pt reports fatigue, headache. Denies fever, malaise, or abrupt weight changes.  HEENT: Pt reports nasal congestion. Denies eye pain, eye redness, ear pain, ringing in the ears, wax buildup, runny nose, bloody nose, or sore throat. Respiratory: Pt reports cough and shortness of breath. Denies difficulty breathing.   Cardiovascular: Denies chest pain, chest tightness, palpitations or swelling in the hands or feet.  Gastrointestinal: Pt reports nausea, vomiting an diarrhea. Denies abdominal pain, bloating, constipation,  or blood in the stool.  Musculoskeletal: Pt reports body aches. Denies decrease in range of motion, difficulty with gait,  or joint pain and swelling.  Skin: Denies redness, rashes, lesions or ulcercations.  Neurological: Pt reports dizziness. Denies dizziness, difficulty with memory, difficulty with speech or problems with balance and coordination.    No other specific complaints in a complete review of systems (except as listed in HPI above).    Observations/Objective:  There were no vitals taken for this visit.  Wt Readings from Last 3 Encounters:  08/25/19 195 lb (88.5 kg)  08/07/19 192 lb (87.1 kg)  05/02/18 189 lb (85.7 kg)    General: Appears his stated age, in NAD. HEENT: Head: normal shape and size; Nose: no congestion noted Throat/Mouth: no hoarseness noted Pulmonary/Chest: Normal effort. No respiratory distress.  Neurological: Alert and oriented.   BMET    Component Value Date/Time   NA 135 08/25/2019 1331   K 3.6 08/25/2019 1331   CL 99 08/25/2019 1331   CO2 23 08/25/2019 1331   GLUCOSE 110 (H) 08/25/2019 1331   BUN 20 08/25/2019  1331   CREATININE 0.81 08/25/2019 1331   CREATININE 0.96 08/07/2019 1509   CALCIUM 9.1 08/25/2019 1331   GFRNONAA >60 08/25/2019 1331   GFRAA >60 08/25/2019 1331    Lipid Panel     Component Value Date/Time   CHOL 132 08/07/2019 1509   TRIG 76 08/07/2019 1509   HDL 50 08/07/2019 1509   CHOLHDL 2.6 08/07/2019 1509   VLDL 20.4 06/05/2017 1433   LDLCALC 66 08/07/2019 1509    CBC    Component Value Date/Time   WBC 9.6 08/25/2019 1331   RBC 4.25 08/25/2019 1331   HGB 13.2  08/25/2019 1331   HCT 38.6 (L) 08/25/2019 1331   PLT 169 08/25/2019 1331   MCV 90.8 08/25/2019 1331   MCH 31.1 08/25/2019 1331   MCHC 34.2 08/25/2019 1331   RDW 12.9 08/25/2019 1331   LYMPHSABS 2.6 05/18/2015 1613   MONOABS 0.7 05/18/2015 1613   EOSABS 0.3 05/18/2015 1613   BASOSABS 0.1 05/18/2015 1613    Hgb A1C Lab Results  Component Value Date   HGBA1C 6.6 (H) 08/07/2019        Assessment and Plan: Fatigue, Headache, Dizziness, Nasal Congestion, Cough, SOB, Nausea and Diarrhea:  Viral respiratory illness vs mild covid He will get tested either OTC or at [facility] Discussed symptomatic care: rest, fluids RX for Pred Taper, Albuterol, Hycodan and Zofran Discussed the importance of masking, social distancing, frequent handwashing and self quarantine until he has a negative Covid test/symptoms resolve  Return precautions discussed  Follow Up Instructions:    I discussed the assessment and treatment plan with the patient. The patient was provided an opportunity to ask questions and all were answered. The patient agreed with the plan and demonstrated an understanding of the instructions.   The patient was advised to call back or seek an in-person evaluation if the symptoms worsen or if the condition fails to improve as anticipated.    Webb Silversmith, NP

## 2020-06-27 NOTE — Patient Instructions (Signed)

## 2020-06-29 ENCOUNTER — Telehealth: Payer: Self-pay | Admitting: Internal Medicine

## 2020-06-29 DIAGNOSIS — U071 COVID-19: Secondary | ICD-10-CM

## 2020-06-29 NOTE — Telephone Encounter (Signed)
Noted. Sign him up for Covid companion.

## 2020-06-29 NOTE — Telephone Encounter (Signed)
Pt's wife called in to update on Covid test. She states [facility]'s results came back positive for Covid.

## 2020-06-29 NOTE — Addendum Note (Signed)
Addended by: Lurlean Nanny on: 06/29/2020 09:57 AM   Modules accepted: Orders

## 2020-06-29 NOTE — Telephone Encounter (Signed)
Temp check and covid monitoring ordered

## 2020-07-05 ENCOUNTER — Emergency Department: Payer: 59

## 2020-07-05 ENCOUNTER — Ambulatory Visit
Admission: EM | Admit: 2020-07-05 | Discharge: 2020-07-05 | Disposition: A | Discharge status: Home / Self Care | Payer: 59

## 2020-07-05 ENCOUNTER — Other Ambulatory Visit: Payer: Self-pay

## 2020-07-05 ENCOUNTER — Emergency Department
Admission: EM | Admit: 2020-07-05 | Discharge: 2020-07-05 | Disposition: A | Discharge status: Home / Self Care | Payer: 59 | Attending: Emergency Medicine | Admitting: Emergency Medicine

## 2020-07-05 DIAGNOSIS — R0602 Shortness of breath: Secondary | ICD-10-CM | POA: Diagnosis not present

## 2020-07-05 DIAGNOSIS — U071 COVID-19: Secondary | ICD-10-CM | POA: Insufficient documentation

## 2020-07-05 DIAGNOSIS — R0902 Hypoxemia: Secondary | ICD-10-CM

## 2020-07-05 DIAGNOSIS — Z5321 Procedure and treatment not carried out due to patient leaving prior to being seen by health care provider: Secondary | ICD-10-CM | POA: Insufficient documentation

## 2020-07-05 LAB — BASIC METABOLIC PANEL
Anion gap: 13 (ref 5–15)
BUN: 27 mg/dL — ABNORMAL HIGH (ref 8–23)
CO2: 24 mmol/L (ref 22–32)
Calcium: 9.4 mg/dL (ref 8.9–10.3)
Chloride: 96 mmol/L — ABNORMAL LOW (ref 98–111)
Creatinine, Ser: 1.28 mg/dL — ABNORMAL HIGH (ref 0.61–1.24)
GFR calc Af Amer: >60 mL/min (ref >60)
GFR calc non Af Amer: 58 mL/min — ABNORMAL LOW (ref >60)
Glucose, Bld: 137 mg/dL — ABNORMAL HIGH (ref 70–99)
Potassium: 4.1 mmol/L (ref 3.5–5.1)
Sodium: 133 mmol/L — ABNORMAL LOW (ref 135–145)

## 2020-07-05 LAB — CBC WITH DIFFERENTIAL/PLATELET
Abs Immature Granulocytes: 0.4 10*3/uL — ABNORMAL HIGH (ref 0.00–0.07)
Basophils Absolute: 0.1 10*3/uL (ref 0.0–0.1)
Basophils Relative: 1 %
Eosinophils Absolute: 0.1 10*3/uL (ref 0.0–0.5)
Eosinophils Relative: 1 %
HCT: 41.9 % (ref 39.0–52.0)
Hemoglobin: 15.2 g/dL (ref 13.0–17.0)
Immature Granulocytes: 3 %
Lymphocytes Relative: 12 %
Lymphs Abs: 1.9 10*3/uL (ref 0.7–4.0)
MCH: 32.7 pg (ref 26.0–34.0)
MCHC: 36.3 g/dL — ABNORMAL HIGH (ref 30.0–36.0)
MCV: 90.1 fL (ref 80.0–100.0)
Monocytes Absolute: 1.3 10*3/uL — ABNORMAL HIGH (ref 0.1–1.0)
Monocytes Relative: 9 %
Neutro Abs: 11.6 10*3/uL — ABNORMAL HIGH (ref 1.7–7.7)
Neutrophils Relative %: 74 %
Platelets: 435 10*3/uL — ABNORMAL HIGH (ref 150–400)
RBC: 4.65 MIL/uL (ref 4.22–5.81)
RDW: 12.2 % (ref 11.5–15.5)
WBC: 15.5 10*3/uL — ABNORMAL HIGH (ref 4.0–10.5)
nRBC: 0 % (ref 0.0–0.2)

## 2020-07-05 LAB — LACTIC ACID, PLASMA: Lactic Acid, Venous: 1.3 mmol/L (ref 0.5–1.9)

## 2020-07-05 NOTE — ED Triage Notes (Signed)
Pt reports COVID positive on 8/24 and has continued to worsen with SOB and low oxygen of 87% at doctor's office.

## 2020-07-05 NOTE — ED Provider Notes (Signed)
Nathan Conway    CSN: [id number] Arrival date & time: 07/05/20  1244      History   Chief Complaint Chief Complaint  Patient presents with  . COVID Positive  . Request X-Ray    HPI Nathan Conway is a 64 y.o. male.   Patient presents with shortness of breath and nonproductive cough.  He tested COVID positive on 06/28/2020.  No treatment received.  He denies fever, chills, sore throat, rash, vomiting, diarrhea, or other symptoms.  He received the [place] vaccine in March.   The history is provided by the patient.    Past Medical History:  Diagnosis Date  . Adenomatous colon polyp   . Allergy   . Asthma   . Chicken pox   . Diverticulosis   . Hyperlipidemia   . Hypertension   . Personal history of kidney stones    passed stones - no surgery    Patient Active Problem List   Diagnosis Date Noted  . Type 2 diabetes mellitus without complication, without long-term current use of insulin ([place]) 06/05/2017  . Hepatic steatosis 12/16/2015  . Asthma, mild persistent 09/17/2014  . HLD (hyperlipidemia) 09/17/2014  . HTN (hypertension) 09/17/2014    Past Surgical History:  Procedure Laterality Date  . COLONOSCOPY  2008   In [place] - polyp  . HERNIA REPAIR    . NASAL SINUS SURGERY    . WISDOM TOOTH EXTRACTION         Home Medications    Prior to Admission medications   Medication Sig Start Date End Date Taking? Authorizing Provider  albuterol (PROAIR HFA) 108 (90 Base) MCG/ACT inhaler Inhale 1-2 puffs into the lungs as needed. 08/07/19   Jearld Fenton, NP  albuterol (VENTOLIN HFA) 108 (90 Base) MCG/ACT inhaler Inhale 2 puffs into the lungs every 6 (six) hours as needed for wheezing or shortness of breath. 06/27/20   Jearld Fenton, NP  aspirin 81 MG tablet Take 81 mg by mouth daily.    [provider]  cholecalciferol (VITAMIN D) 1000 units tablet Take 1,000 Units by mouth daily.    [provider]  Fluticasone  Furoate-Vilanterol (BREO ELLIPTA) 200-25 MCG/INH AEPB Inhale 2 puffs into the lungs daily.    [provider]  HYDROcodone-homatropine (HYCODAN) 5-1.5 MG/5ML syrup Take 5 mLs by mouth every 8 (eight) hours as needed for cough. 06/27/20   Jearld Fenton, NP  losartan-hydrochlorothiazide (HYZAAR) 100-12.5 MG tablet Take 1 tablet by mouth daily. 08/14/19   Jearld Fenton, NP  ondansetron (ZOFRAN) 4 MG tablet Take 1 tablet (4 mg total) by mouth every 8 (eight) hours as needed. 06/27/20   Jearld Fenton, NP  predniSONE (DELTASONE) 10 MG tablet Take 3 tabs on days 1-2, take 2 tabs on days 3-4, take 1 tab on days 5-6 06/27/20   Jearld Fenton, NP  simvastatin (ZOCOR) 20 MG tablet TAKE 1 TABLET BY MOUTH ONCE A DAY AT [facility] THE EVENING 02/18/20   Jearld Fenton, NP  traMADol (ULTRAM) 50 MG tablet Take 1 tablet (50 mg total) by mouth every 6 (six) hours as needed. 08/25/19   Harvest Dark, MD    Family History Family History  Problem Relation Age of Onset  . Hypertension Mother   . Alcohol abuse Father   . Lung cancer Father   . Diabetes Neg Hx   . Heart disease Neg Hx   . Stroke Neg Hx   . Colon cancer  Neg Hx   . Esophageal cancer Neg Hx   . Rectal cancer Neg Hx   . Stomach cancer Neg Hx     Social History Social History   Tobacco Use  . Smoking status: Former Smoker    Packs/day: 2.00    Types: Cigarettes    Quit date: 11/05/1977    Years since quitting: 42.6  . Smokeless tobacco: Never Used  Substance Use Topics  . Alcohol use: Yes    Alcohol/week: 2.0 standard drinks    Types: 2 Shots of liquor per week    Comment: socially  . Drug use: No     Allergies   Patient has no known allergies.   Review of Systems Review of Systems  Constitutional: Negative for chills and fever.  HENT: Negative for ear pain and sore throat.   Eyes: Negative for pain and visual disturbance.  Respiratory: Positive for cough and shortness of breath.   Cardiovascular: Negative for chest  pain and palpitations.  Gastrointestinal: Negative for abdominal pain, diarrhea and vomiting.  Genitourinary: Negative for dysuria and hematuria.  Musculoskeletal: Negative for arthralgias and back pain.  Skin: Negative for color change and rash.  Neurological: Negative for seizures and syncope.  All other systems reviewed and are negative.    Physical Exam Triage Vital Signs ED Triage Vitals  Enc Vitals Group     BP 07/05/20 1253 106/76     Pulse Rate 07/05/20 1253 (!) 114     Resp 07/05/20 1253 (!) 24     Temp 07/05/20 1253 98.9 F (37.2 C)     Temp src --      SpO2 07/05/20 1253 (!) 89 %     Weight --      Height --      Head Circumference --      Peak Flow --      Pain Score 07/05/20 1251 8     Pain Loc --      Pain Edu? --      Excl. in [place]? --    No data found.  Updated Vital Signs BP 106/76   Pulse (!) 114   Temp 98.9 F (37.2 C)   Resp (!) 24   SpO2 (!) 89%   Visual Acuity Right Eye Distance:   Left Eye Distance:   Bilateral Distance:    Right Eye Near:   Left Eye Near:    Bilateral Near:     Physical Exam Vitals and nursing note reviewed.  Constitutional:      General: He is not in acute distress.    Appearance: He is well-developed. He is ill-appearing.  HENT:     Head: Normocephalic and atraumatic.     Mouth/Throat:     Mouth: Mucous membranes are moist.     Pharynx: Oropharynx is clear.  Eyes:     Conjunctiva/sclera: Conjunctivae normal.  Cardiovascular:     Rate and Rhythm: Normal rate and regular rhythm.     Heart sounds: No murmur heard.   Pulmonary:     Effort: Pulmonary effort is normal. No respiratory distress.     Breath sounds: Normal breath sounds. No wheezing or rhonchi.     Comments: Lung sounds diminished. Abdominal:     Palpations: Abdomen is soft.     Tenderness: There is no abdominal tenderness. There is no guarding or rebound.  Musculoskeletal:     Cervical back: Neck supple.  Skin:    General: Skin is warm and dry.  Findings: No rash.  Neurological:     General: No focal deficit present.     Mental Status: He is alert and oriented to person, place, and time.     Gait: Gait normal.  Psychiatric:        Mood and Affect: Mood normal.        Behavior: Behavior normal.      UC Treatments / Results  Labs (all labs ordered are listed, but only abnormal results are displayed) Labs Reviewed - No data to display  EKG   Radiology No results found.  Procedures Procedures (including critical care time)  Medications Ordered in UC Medications - No data to display  Initial Impression / Assessment and Plan / UC Course  I have reviewed the triage vital signs and the nursing notes.  Pertinent labs & imaging results that were available during my care of the patient were reviewed by me and considered in my medical decision making (see chart for details).   COVID-19.  Shortness of breath and hypoxia.  Patient tested positive for COVID on 06/28/2020.  O2 sat 89% today.  Baseline is 98 to 100% per medical record.  Sending patient to the ED for evaluation.  Patient declines EMS and states his wife will drive him there.     Final Clinical Impressions(s) / UC Diagnoses   Final diagnoses:  COVID-19  SOB (shortness of breath)  Hypoxia     Discharge Instructions     Go to the emergency department for evaluation of your shortness of breath and low oxygen level in the setting of COVID positive.      ED Prescriptions    None     PDMP not reviewed this encounter.   Sharion Balloon, NP 07/05/20 1323

## 2020-07-05 NOTE — ED Triage Notes (Signed)
Patient reports he tested positive for COVID 19 on 06/28/2020. PCP referred him to [facility] for chest xray.

## 2020-07-05 NOTE — ED Notes (Signed)
Patient is being discharged from the Urgent Care and sent to the Emergency Department via POV . Per Barkley Boards, NP, patient is in need of higher level of care due to hypoxia, COVID +, shortness of breath. Patient is aware and verbalizes understanding of plan of care.  Vitals:   07/05/20 1253  BP: 106/76  Pulse: (!) 114  Resp: (!) 24  Temp: 98.9 F (37.2 C)  SpO2: (!) 89%   Reports called to [place] at [facility] ED

## 2020-07-05 NOTE — Discharge Instructions (Addendum)
Go to the emergency department for evaluation of your shortness of breath and low oxygen level in the setting of COVID positive.

## 2020-07-06 ENCOUNTER — Other Ambulatory Visit: Payer: Self-pay | Admitting: Oncology

## 2020-07-06 ENCOUNTER — Encounter: Payer: Self-pay | Admitting: Family Medicine

## 2020-07-06 ENCOUNTER — Telehealth: Payer: 59 | Admitting: Internal Medicine

## 2020-07-06 ENCOUNTER — Encounter: Payer: Self-pay | Admitting: Oncology

## 2020-07-06 ENCOUNTER — Telehealth (INDEPENDENT_AMBULATORY_CARE_PROVIDER_SITE_OTHER): Payer: 59 | Admitting: Family Medicine

## 2020-07-06 VITALS — HR 91 | Ht 70.0 in

## 2020-07-06 DIAGNOSIS — J40 Bronchitis, not specified as acute or chronic: Secondary | ICD-10-CM | POA: Insufficient documentation

## 2020-07-06 DIAGNOSIS — J1282 Pneumonia due to coronavirus disease 2019: Secondary | ICD-10-CM

## 2020-07-06 DIAGNOSIS — U071 COVID-19: Secondary | ICD-10-CM

## 2020-07-06 MED ORDER — AZITHROMYCIN 250 MG PO TABS: ORAL_TABLET | ORAL | 0 refills | Status: DC

## 2020-07-06 MED ORDER — HYDROCODONE-HOMATROPINE 5-1.5 MG/5ML PO SYRP: 5.0000 mL | ORAL_SOLUTION | Freq: Three times a day (TID) | ORAL | 0 refills | Status: DC | PRN

## 2020-07-06 MED ORDER — PREDNISONE 10 MG PO TABS: ORAL_TABLET | ORAL | 0 refills | Status: DC

## 2020-07-06 NOTE — Progress Notes (Signed)
I connected by phone with  Nathan Conway to discuss the potential use of an new treatment for mild to moderate COVID-19 viral infection in non-hospitalized patients.   This patient is a age/sex that meets the FDA criteria for Emergency Use Authorization of casirivimab\imdevimab.  Has a (+) direct SARS-CoV-2 viral test result 1. Has mild or moderate COVID-19  2. Is ? 66 years of age and weighs ? 40 kg 3. Is NOT hospitalized due to COVID-19 4. Is NOT requiring oxygen therapy or requiring an increase in baseline oxygen flow rate due to COVID-19 5. Is within 10 days of symptom onset 6. Has at least one of the high risk factor(s) for progression to severe COVID-19 and/or hospitalization as defined in EUA. ? Specific high risk criteria : Age    Symptom onset 06/28/20   I have spoken and communicated the following to the patient or parent/caregiver:   1. FDA has authorized the emergency use of casirivimab\imdevimab for the treatment of mild to moderate COVID-19 in adults and pediatric patients with positive results of direct SARS-CoV-2 viral testing who are 44 years of age and older weighing at least 40 kg, and who are at high risk for progressing to severe COVID-19 and/or hospitalization.   2. The significant known and potential risks and benefits of casirivimab\imdevimab, and the extent to which such potential risks and benefits are unknown.   3. Information on available alternative treatments and the risks and benefits of those alternatives, including clinical trials.   4. Patients treated with casirivimab\imdevimab should continue to self-isolate and use infection control measures (e.g., wear mask, isolate, social distance, avoid sharing personal items, clean and disinfect "high touch" surfaces, and frequent handwashing) according to CDC guidelines.    5. The patient or parent/caregiver has the option to accept or refuse casirivimab\imdevimab .   After reviewing this information with the patient,  The patient agreed to proceed with receiving casirivimab\imdevimab infusion and will be provided a copy of the Fact sheet prior to receiving the infusion.Rulon Abide, AGNP-C [phone] ([place])

## 2020-07-06 NOTE — Progress Notes (Addendum)
Virtual Visit via Video Note  I connected with Nathan Conway on 07/06/20 at 10:40 AM EDT by a video enabled telemedicine application and verified that I am speaking with the correct person using two identifiers.  Location/ persons in visit  Patient: home with wife  Provider: home    I discussed the limitations of evaluation and management by telemedicine and the availability of in person appointments. The patient expressed understanding and agreed to proceed.  History of Present Illness: Pt is home c/o sob, cough , chills and body aches.  He tested positive on 8/24   He has a hx of asthma and c/o of inc sob.   Pt was tested at an urgent care and went to ER yesterday but left after 7 hours.   cxr showed pneumonia and wbc 15 Pt stated [facility] was [facility]   Past Medical History:  Diagnosis Date   Adenomatous colon polyp    Allergy    Asthma    Chicken pox    Diverticulosis    Hyperlipidemia    Hypertension    Personal history of kidney stones    passed stones - no surgery   Outpatient Encounter Medications as of 07/06/2020  Medication Sig Note   albuterol (PROAIR HFA) 108 (90 Base) MCG/ACT inhaler Inhale 1-2 puffs into the lungs as needed.    albuterol (VENTOLIN HFA) 108 (90 Base) MCG/ACT inhaler Inhale 2 puffs into the lungs every 6 (six) hours as needed for wheezing or shortness of breath.    aspirin 81 MG tablet Take 81 mg by mouth daily.    cholecalciferol (VITAMIN D) 1000 units tablet Take 1,000 Units by mouth daily.    Fluticasone Furoate-Vilanterol (BREO ELLIPTA) 200-25 MCG/INH AEPB Inhale 2 puffs into the lungs daily. 10/03/2015: States he only uses it when necessary.  He used it two weeks ago due to a bad cough and congestion.   HYDROcodone-homatropine (HYCODAN) 5-1.5 MG/5ML syrup Take 5 mLs by mouth every 8 (eight) hours as needed for cough.    losartan-hydrochlorothiazide (HYZAAR) 100-12.5 MG tablet Take 1 tablet by mouth daily.    ondansetron (ZOFRAN) 4 MG  tablet Take 1 tablet (4 mg total) by mouth every 8 (eight) hours as needed.    simvastatin (ZOCOR) 20 MG tablet TAKE 1 TABLET BY MOUTH ONCE A DAY AT SIXIN THE EVENING    traMADol (ULTRAM) 50 MG tablet Take 1 tablet (50 mg total) by mouth every 6 (six) hours as needed.    [DISCONTINUED] HYDROcodone-homatropine (HYCODAN) 5-1.5 MG/5ML syrup Take 5 mLs by mouth every 8 (eight) hours as needed for cough.    azithromycin (ZITHROMAX Z-PAK) 250 MG tablet As directed    predniSONE (DELTASONE) 10 MG tablet Take 3 tabs on days 1-2, take 2 tabs on days 3-4, take 1 tab on days 5-6 (Patient not taking: Reported on 07/06/2020)    predniSONE (DELTASONE) 10 MG tablet TAKE 3 TABLETS PO QD FOR 3 DAYS THEN TAKE 2 TABLETS PO QD FOR 3 DAYS THEN TAKE 1 TABLET PO QD FOR 3 DAYS THEN TAKE 1/2 TAB PO QD FOR 3 DAYS    No facility-administered encounter medications on file as of 07/06/2020.   Observations/Objective: Vitals:   07/06/20 1042  Pulse: 91  SpO2: 92%  no fever today Pt sitting in chair comfortable No sob ,   NAD   Assessment and Plan: 1. Pneumonia due to COVID-19 virus After reviewing chart --- pt did go to er but left . cxr --+ pneumonia  and leukocytosis  Pt was told multiple times to go to er if pulse ox stayed at 92 or lower or if sob worsened  Will schdule covid clinic as well   - HYDROcodone-homatropine (HYCODAN) 5-1.5 MG/5ML syrup; Take 5 mLs by mouth every 8 (eight) hours as needed for cough.  Dispense: 120 mL; Refill: 0 - azithromycin (ZITHROMAX Z-PAK) 250 MG tablet; As directed  Dispense: 6 each; Refill: 0 - predniSONE (DELTASONE) 10 MG tablet; TAKE 3 TABLETS PO QD FOR 3 DAYS THEN TAKE 2 TABLETS PO QD FOR 3 DAYS THEN TAKE 1 TABLET PO QD FOR 3 DAYS THEN TAKE 1/2 TAB PO QD FOR 3 DAYS  Dispense: 20 tablet; Refill: 0  Follow Up Instructions:    I discussed the assessment and treatment plan with the patient. The patient was provided an opportunity to ask questions and all were answered. The  patient agreed with the plan and demonstrated an understanding of the instructions.   The patient was advised to call back or seek an in-person evaluation if the symptoms worsen or if the condition fails to improve as anticipated.  I provided 25 minutes of non-face-to-face time during this encounter.   Ann Held, DO

## 2020-07-07 ENCOUNTER — Ambulatory Visit (HOSPITAL_COMMUNITY)
Admission: RE | Admit: 2020-07-07 | Discharge: 2020-07-07 | Disposition: A | Discharge status: Home / Self Care | Payer: 59 | Source: Ambulatory Visit | Attending: Pulmonary Disease | Admitting: Pulmonary Disease

## 2020-07-07 DIAGNOSIS — U071 COVID-19: Secondary | ICD-10-CM | POA: Insufficient documentation

## 2020-07-07 MED ORDER — SODIUM CHLORIDE 0.9 % IV SOLN: INTRAVENOUS | Status: DC | PRN

## 2020-07-07 MED ORDER — METHYLPREDNISOLONE SODIUM SUCC 125 MG IJ SOLR: 125.0000 mg | Freq: Once | INTRAMUSCULAR | Status: DC | PRN

## 2020-07-07 MED ORDER — FAMOTIDINE IN NACL 20-0.9 MG/50ML-% IV SOLN: 20.0000 mg | Freq: Once | INTRAVENOUS | Status: DC | PRN

## 2020-07-07 MED ORDER — SODIUM CHLORIDE 0.9 % IV SOLN
1200.0000 mg | Freq: Once | INTRAVENOUS | Status: AC
  Administered 2020-07-07: 1200 mg via INTRAVENOUS
  Filled 2020-07-07: qty 10

## 2020-07-07 MED ORDER — EPINEPHRINE 0.3 MG/0.3ML IJ SOAJ: 0.3000 mg | Freq: Once | INTRAMUSCULAR | Status: DC | PRN

## 2020-07-07 MED ORDER — DIPHENHYDRAMINE HCL 50 MG/ML IJ SOLN: 50.0000 mg | Freq: Once | INTRAMUSCULAR | Status: DC | PRN

## 2020-07-07 MED ORDER — ALBUTEROL SULFATE HFA 108 (90 BASE) MCG/ACT IN AERS: 2.0000 | INHALATION_SPRAY | Freq: Once | RESPIRATORY_TRACT | Status: DC | PRN

## 2020-07-07 NOTE — Progress Notes (Signed)
  Diagnosis: COVID-19  Physician: Dr. Joya Gaskins  Procedure: [facility] Infusion Clinic Med: casirivimab\imdevimab infusion - Provided patient with casirivimab\imdevimab fact sheet for patients, parents and caregivers prior to infusion.  Complications: No immediate complications noted.  Discharge: Discharged home   [facility] 07/07/2020

## 2020-07-07 NOTE — Discharge Instructions (Signed)
10 Things You Can Do to Manage Your COVID-19 Symptoms at Home If you have possible or confirmed COVID-19: 1. Stay home from work and school. And stay away from other public places. If you must go out, avoid using any kind of public transportation, ridesharing, or taxis. 2. Monitor your symptoms carefully. If your symptoms get worse, call your healthcare provider immediately. 3. Get rest and stay hydrated. 4. If you have a medical appointment, call the healthcare provider ahead of time and tell them that you have or may have COVID-19. 5. For medical emergencies, call 911 and notify the dispatch personnel that you have or may have COVID-19. 6. Cover your cough and sneezes with a tissue or use the inside of your elbow. 7. Wash your hands often with soap and water for at least 20 seconds or clean your hands with an alcohol-based hand sanitizer that contains at least 60% alcohol. 8. As much as possible, stay in a specific room and away from other people in your home. Also, you should use a separate bathroom, if available. If you need to be around other people in or outside of the home, wear a mask. 9. Avoid sharing personal items with other people in your household, like dishes, towels, and bedding. 10. Clean all surfaces that are touched often, like counters, tabletops, and doorknobs. Use household cleaning sprays or wipes according to the label instructions. cdc.gov/coronavirus 05/06/2019 This information is not intended to replace advice given to you by your health care provider. Make sure you discuss any questions you have with your health care provider. Document Revised: 10/08/2019 Document Reviewed: 10/08/2019 Elsevier Patient Education  2020 [employer].   COVID-19 COVID-19 is a respiratory infection that is caused by a virus called severe acute respiratory syndrome coronavirus 2 (SARS-CoV-2). The disease is also known as coronavirus disease or novel coronavirus. In some people, the virus may  not cause any symptoms. In others, it may cause a serious infection. The infection can get worse quickly and can lead to complications, such as:  Pneumonia, or infection of the lungs.  Acute respiratory distress syndrome or ARDS. This is a condition in which fluid build-up in the lungs prevents the lungs from filling with air and passing oxygen into the blood.  Acute respiratory failure. This is a condition in which there is not enough oxygen passing from the lungs to the body or when carbon dioxide is not passing from the lungs out of the body.  Sepsis or septic shock. This is a serious bodily reaction to an infection.  Blood clotting problems.  Secondary infections due to bacteria or fungus.  Organ failure. This is when your body's organs stop working. The virus that causes COVID-19 is contagious. This means that it can spread from person to person through droplets from coughs and sneezes (respiratory secretions). What are the causes? This illness is caused by a virus. You may catch the virus by:  Breathing in droplets from an infected person. Droplets can be spread by a person breathing, speaking, singing, coughing, or sneezing.  Touching something, like a table or a doorknob, that was exposed to the virus (contaminated) and then touching your mouth, nose, or eyes. What increases the risk? Risk for infection You are more likely to be infected with this virus if you:  Are within 6 feet (2 meters) of a person with COVID-19.  Provide care for or live with a person who is infected with COVID-19.  Spend time in crowded indoor spaces or   live in shared housing. Risk for serious illness You are more likely to become seriously ill from the virus if you:  Are 50 years of age or older. The higher your age, the more you are at risk for serious illness.  Live in a nursing home or long-term care facility.  Have cancer.  Have a long-term (chronic) disease such as: ? Chronic lung disease,  including chronic obstructive pulmonary disease or asthma. ? A long-term disease that lowers your body's ability to fight infection (immunocompromised). ? Heart disease, including heart failure, a condition in which the arteries that lead to the heart become narrow or blocked (coronary artery disease), a disease which makes the heart muscle thick, weak, or stiff (cardiomyopathy). ? Diabetes. ? Chronic kidney disease. ? Sickle cell disease, a condition in which red blood cells have an abnormal "sickle" shape. ? Liver disease.  Are obese. What are the signs or symptoms? Symptoms of this condition can range from mild to severe. Symptoms may appear any time from 2 to 14 days after being exposed to the virus. They include:  A fever or chills.  A cough.  Difficulty breathing.  Headaches, body aches, or muscle aches.  Runny or stuffy (congested) nose.  A sore throat.  New loss of taste or smell. Some people may also have stomach problems, such as nausea, vomiting, or diarrhea. Other people may not have any symptoms of COVID-19. How is this diagnosed? This condition may be diagnosed based on:  Your signs and symptoms, especially if: ? You live in an area with a COVID-19 outbreak. ? You recently traveled to or from an area where the virus is common. ? You provide care for or live with a person who was diagnosed with COVID-19. ? You were exposed to a person who was diagnosed with COVID-19.  A physical exam.  Lab tests, which may include: ? Taking a sample of fluid from the back of your nose and throat (nasopharyngeal fluid), your nose, or your throat using a swab. ? A sample of mucus from your lungs (sputum). ? Blood tests.  Imaging tests, which may include, X-rays, CT scan, or ultrasound. How is this treated? At present, there is no medicine to treat COVID-19. Medicines that treat other diseases are being used on a trial basis to see if they are effective against COVID-19. Your  health care provider will talk with you about ways to treat your symptoms. For most people, the infection is mild and can be managed at home with rest, fluids, and over-the-counter medicines. Treatment for a serious infection usually takes places in a hospital intensive care unit (ICU). It may include one or more of the following treatments. These treatments are given until your symptoms improve.  Receiving fluids and medicines through an IV.  Supplemental oxygen. Extra oxygen is given through a tube in the nose, a face mask, or a hood.  Positioning you to lie on your stomach (prone position). This makes it easier for oxygen to get into the lungs.  Continuous positive airway pressure (CPAP) or bi-level positive airway pressure (BPAP) machine. This treatment uses mild air pressure to keep the airways open. A tube that is connected to a motor delivers oxygen to the body.  Ventilator. This treatment moves air into and out of the lungs by using a tube that is placed in your windpipe.  Tracheostomy. This is a procedure to create a hole in the neck so that a breathing tube can be inserted.  Extracorporeal membrane   oxygenation (ECMO). This procedure gives the lungs a chance to recover by taking over the functions of the heart and lungs. It supplies oxygen to the body and removes carbon dioxide. Follow these instructions at home: Lifestyle  If you are sick, stay home except to get medical care. Your health care provider will tell you how long to stay home. Call your health care provider before you go for medical care.  Rest at home as told by your health care provider.  Do not use any products that contain nicotine or tobacco, such as cigarettes, e-cigarettes, and chewing tobacco. If you need help quitting, ask your health care provider.  Return to your normal activities as told by your health care provider. Ask your health care provider what activities are safe for you. General  instructions  Take over-the-counter and prescription medicines only as told by your health care provider.  Drink enough fluid to keep your urine pale yellow.  Keep all follow-up visits as told by your health care provider. This is important. How is this prevented?  There is no vaccine to help prevent COVID-19 infection. However, there are steps you can take to protect yourself and others from this virus. To protect yourself:   Do not travel to areas where COVID-19 is a risk. The areas where COVID-19 is reported change often. To identify high-risk areas and travel restrictions, check the CDC travel website: wwwnc.cdc.gov/travel/notices  If you live in, or must travel to, an area where COVID-19 is a risk, take precautions to avoid infection. ? Stay away from people who are sick. ? Wash your hands often with soap and water for 20 seconds. If soap and water are not available, use an alcohol-based hand sanitizer. ? Avoid touching your mouth, face, eyes, or nose. ? Avoid going out in public, follow guidance from your state and local health authorities. ? If you must go out in public, wear a cloth face covering or face mask. Make sure your mask covers your nose and mouth. ? Avoid crowded indoor spaces. Stay at least 6 feet (2 meters) away from others. ? Disinfect objects and surfaces that are frequently touched every day. This may include:  Counters and tables.  Doorknobs and light switches.  Sinks and faucets.  Electronics, such as phones, remote controls, keyboards, computers, and tablets. To protect others: If you have symptoms of COVID-19, take steps to prevent the virus from spreading to others.  If you think you have a COVID-19 infection, contact your health care provider right away. Tell your health care team that you think you may have a COVID-19 infection.  Stay home. Leave your house only to seek medical care. Do not use public transport.  Do not travel while you are  sick.  Wash your hands often with soap and water for 20 seconds. If soap and water are not available, use alcohol-based hand sanitizer.  Stay away from other members of your household. Let healthy household members care for children and pets, if possible. If you have to care for children or pets, wash your hands often and wear a mask. If possible, stay in your own room, separate from others. Use a different bathroom.  Make sure that all people in your household wash their hands well and often.  Cough or sneeze into a tissue or your sleeve or elbow. Do not cough or sneeze into your hand or into the air.  Wear a cloth face covering or face mask. Make sure your mask covers your nose   and mouth. Where to find more information  Centers for Disease Control and Prevention: PurpleGadgets.be  World Health Organization: https://www.castaneda.info/ Contact a health care provider if:  You live in or have traveled to an area where COVID-19 is a risk and you have symptoms of the infection.  You have had contact with someone who has COVID-19 and you have symptoms of the infection. Get help right away if:  You have trouble breathing.  You have pain or pressure in your chest.  You have confusion.  You have bluish lips and fingernails.  You have difficulty waking from sleep.  You have symptoms that get worse. These symptoms may represent a serious problem that is an emergency. Do not wait to see if the symptoms will go away. Get medical help right away. Call your local emergency services (911 in the [place] Do not drive yourself to the hospital. Let the emergency medical personnel know if you think you have COVID-19. Summary  COVID-19 is a respiratory infection that is caused by a virus. It is also known as coronavirus disease or novel coronavirus. It can cause serious infections, such as pneumonia, acute respiratory distress syndrome, acute respiratory failure,  or sepsis.  The virus that causes COVID-19 is contagious. This means that it can spread from person to person through droplets from breathing, speaking, singing, coughing, or sneezing.  You are more likely to develop a serious illness if you are 56 years of age or older, have a weak immune system, live in a nursing home, or have chronic disease.  There is no medicine to treat COVID-19. Your health care provider will talk with you about ways to treat your symptoms.  Take steps to protect yourself and others from infection. Wash your hands often and disinfect objects and surfaces that are frequently touched every day. Stay away from people who are sick and wear a mask if you are sick. This information is not intended to replace advice given to you by your health care provider. Make sure you discuss any questions you have with your health care provider. Document Revised: 08/21/2019 Document Reviewed: 11/27/2018 Elsevier Patient Education  2020 [place].  COVID-19: How to Protect Yourself and Others Know how it spreads  There is currently no vaccine to prevent coronavirus disease 2019 (COVID-19).  The best way to prevent illness is to avoid being exposed to this virus.  The virus is thought to spread mainly from person-to-person. ? Between people who are in close contact with one another (within about 6 feet). ? Through respiratory droplets produced when an infected person coughs, sneezes or talks. ? These droplets can land in the mouths or noses of people who are nearby or possibly be inhaled into the lungs. ? COVID-19 may be spread by people who are not showing symptoms. Everyone should Clean your hands often  Wash your hands often with soap and water for at least 20 seconds especially after you have been in a public place, or after blowing your nose, coughing, or sneezing.  If soap and water are not readily available, use a hand sanitizer that contains at least 60% alcohol. Cover  all surfaces of your hands and rub them together until they feel dry.  Avoid touching your eyes, nose, and mouth with unwashed hands. Avoid close contact  Limit contact with others as much as possible.  Avoid close contact with people who are sick.  Put distance between yourself and other people. ? Remember that some people without symptoms may be  able to spread virus. ? This is especially important for people who are at higher risk of getting very GainPain.com.cy Cover your mouth and nose with a mask when around others  You could spread COVID-19 to others even if you do not feel sick.  Everyone should wear a mask in public settings and when around people not living in their household, especially when social distancing is difficult to maintain. ? Masks should not be placed on young children under age 58, anyone who has trouble breathing, or is unconscious, incapacitated or otherwise unable to remove the mask without assistance.  The mask is meant to protect other people in case you are infected.  Do NOT use a facemask meant for a Dietitian.  Continue to keep about 6 feet between yourself and others. The mask is not a substitute for social distancing. Cover coughs and sneezes  Always cover your mouth and nose with a tissue when you cough or sneeze or use the inside of your elbow.  Throw used tissues in the trash.  Immediately wash your hands with soap and water for at least 20 seconds. If soap and water are not readily available, clean your hands with a hand sanitizer that contains at least 60% alcohol. Clean and disinfect  Clean AND disinfect frequently touched surfaces daily. This includes tables, doorknobs, light switches, countertops, handles, desks, phones, keyboards, toilets, faucets, and sinks. RackRewards.fr  If surfaces are  dirty, clean them: Use detergent or soap and water prior to disinfection.  Then, use a household disinfectant. You can see a list of EPA-registered household disinfectants here. michellinders.com 07/08/2019 This information is not intended to replace advice given to you by your health care provider. Make sure you discuss any questions you have with your health care provider. Document Revised: 07/16/2019 Document Reviewed: 05/14/2019 Elsevier Patient Education  [place]. What types of side effects do monoclonal antibody drugs cause?  Common side effects  In general, the more common side effects caused by monoclonal antibody drugs include: . Allergic reactions, such as hives or itching . Flu-like signs and symptoms, including chills, fatigue, fever, and muscle aches and pains . Nausea, vomiting . Diarrhea . Skin rashes . Low blood pressure   The CDC is recommending patients who receive monoclonal antibody treatments wait at least 90 days before being vaccinated.  Currently, there are no data on the safety and efficacy of mRNA COVID-19 vaccines in persons who received monoclonal antibodies or convalescent plasma as part of COVID-19 treatment. Based on the estimated half-life of such therapies as well as evidence suggesting that reinfection is uncommon in the 90 days after initial infection, vaccination should be deferred for at least 90 days, as a precautionary measure until additional information becomes available, to avoid interference of the antibody treatment with vaccine-induced immune responses.

## 2020-07-08 ENCOUNTER — Other Ambulatory Visit: Payer: Self-pay | Admitting: Internal Medicine

## 2020-08-08 ENCOUNTER — Encounter: Payer: 59 | Admitting: Gastroenterology

## 2020-08-10 ENCOUNTER — Other Ambulatory Visit: Payer: Self-pay | Admitting: Internal Medicine

## 2020-08-12 ENCOUNTER — Encounter: Payer: 59 | Admitting: Internal Medicine

## 2020-09-02 ENCOUNTER — Other Ambulatory Visit: Payer: Self-pay

## 2020-09-02 ENCOUNTER — Ambulatory Visit (AMBULATORY_SURGERY_CENTER): Payer: Self-pay | Admitting: *Deleted

## 2020-09-02 VITALS — Ht 70.0 in | Wt 189.0 lb

## 2020-09-02 DIAGNOSIS — Z86010 Personal history of colonic polyps: Secondary | ICD-10-CM

## 2020-09-02 NOTE — Progress Notes (Signed)
No egg or soy allergy known to patient  No issues with past sedation with any surgeries or procedures no intubation problems in the past  No FH of Malignant Hyperthermia No diet pills per patient No home 02 use per patient  No blood thinners per patient  Pt denies issues with constipation  No A fib or A flutter  EMMI video to pt or via [place] 19 guidelines implemented in PV today with Pt and RN   J and J 01-13-2020  At [facility], pt states his wife had a colon 2 weeks ago and she did the Miralax prep, he wants the Miralax prep as she said it was not hard- he didn't want plenvu after going over Miralax vs Plenvu prep   Due to the COVID-19 pandemic we are asking patients to follow these guidelines. Please only bring one care partner. Please be aware that your care partner may wait in the car in the parking lot or if they feel like they will be too hot to wait in the car, they may wait in the lobby on the 4th floor. All care partners are required to wear a mask the entire time (we do not have any that we can provide them), they need to practice social distancing, and we will do a Covid check for all patient's and care partners when you arrive. Also we will check their temperature and your temperature. If the care partner waits in their car they need to stay in the parking lot the entire time and we will call them on their cell phone when the patient is ready for discharge so they can bring the car to the front of the building. Also all patient's will need to wear a mask into building.

## 2020-09-06 ENCOUNTER — Encounter: Payer: Self-pay | Admitting: Gastroenterology

## 2020-09-06 ENCOUNTER — Other Ambulatory Visit: Payer: Self-pay | Admitting: Internal Medicine

## 2020-09-16 ENCOUNTER — Ambulatory Visit (AMBULATORY_SURGERY_CENTER): Payer: 59 | Admitting: Gastroenterology

## 2020-09-16 ENCOUNTER — Other Ambulatory Visit: Payer: Self-pay

## 2020-09-16 ENCOUNTER — Encounter: Payer: Self-pay | Admitting: Gastroenterology

## 2020-09-16 VITALS — BP 121/69 | HR 54 | Temp 97.2°F | Resp 19 | Ht 70.0 in | Wt 189.0 lb

## 2020-09-16 DIAGNOSIS — D122 Benign neoplasm of ascending colon: Secondary | ICD-10-CM

## 2020-09-16 DIAGNOSIS — D123 Benign neoplasm of transverse colon: Secondary | ICD-10-CM

## 2020-09-16 DIAGNOSIS — Z86010 Personal history of colonic polyps: Secondary | ICD-10-CM

## 2020-09-16 MED ORDER — SODIUM CHLORIDE 0.9 % IV SOLN: 500.0000 mL | INTRAVENOUS | Status: DC

## 2020-09-16 NOTE — Patient Instructions (Signed)
Handouts given for polyps, diverticulosis, hemorrhoids and high fiber diet.  Await pathology results.  YOU HAD AN ENDOSCOPIC PROCEDURE TODAY AT THE [facility]:   Refer to the procedure report that was given to you for any specific questions about what was found during the examination.  If the procedure report does not answer your questions, please call your gastroenterologist to clarify.  If you requested that your care partner not be given the details of your procedure findings, then the procedure report has been included in a sealed envelope for you to review at your convenience later.  YOU SHOULD EXPECT: Some feelings of bloating in the abdomen. Passage of more gas than usual.  Walking can help get rid of the air that was put into your GI tract during the procedure and reduce the bloating. If you had a lower endoscopy (such as a colonoscopy or flexible sigmoidoscopy) you may notice spotting of blood in your stool or on the toilet paper. If you underwent a bowel prep for your procedure, you may not have a normal bowel movement for a few days.  Please Note:  You might notice some irritation and congestion in your nose or some drainage.  This is from the oxygen used during your procedure.  There is no need for concern and it should clear up in a day or so.  SYMPTOMS TO REPORT IMMEDIATELY:  Following lower endoscopy (colonoscopy or flexible sigmoidoscopy):  Excessive amounts of blood in the stool  Significant tenderness or worsening of abdominal pains  Swelling of the abdomen that is new, acute  Fever of 100F or higher  For urgent or emergent issues, a gastroenterologist can be reached at any hour by calling [phone]. Do not use MyChart messaging for urgent concerns.    DIET:  We do recommend a small meal at first, but then you may proceed to your regular diet.  Drink plenty of fluids but you should avoid alcoholic beverages for 24 hours.  ACTIVITY:  You should plan to  take it easy for the rest of today and you should NOT DRIVE or use heavy machinery until tomorrow (because of the sedation medicines used during the test).    FOLLOW UP: Our staff will call the number listed on your records 48-72 hours following your procedure to check on you and address any questions or concerns that you may have regarding the information given to you following your procedure. If we do not reach you, we will leave a message.  We will attempt to reach you two times.  During this call, we will ask if you have developed any symptoms of COVID 19. If you develop any symptoms (ie: fever, flu-like symptoms, shortness of breath, cough etc.) before then, please call [phone].  If you test positive for Covid 19 in the 2 weeks post procedure, please call and report this information to us.    If any biopsies were taken you will be contacted by phone or by letter within the next 1-3 weeks.  Please call us at [phone] if you have not heard about the biopsies in 3 weeks.    SIGNATURES/CONFIDENTIALITY: You and/or your care partner have signed paperwork which will be entered into your electronic medical record.  These signatures attest to the fact that that the information above on your After Visit Summary has been reviewed and is understood.  Full responsibility of the confidentiality of this discharge information lies with you and/or your care-partner.  

## 2020-09-16 NOTE — Progress Notes (Signed)
PT taken to PACU. Monitors in place. VSS. Report given to RN. 

## 2020-09-16 NOTE — Op Note (Signed)
[facility] Patient Name: Nathan Conway Procedure Date: 09/16/2020 3:17 PM MRN: [id number] Endoscopist: Ladene Artist , MD Age: 64 Referring MD:  Date of Birth: [birth year] Gender: Male Account #: [id number] Procedure:                Colonoscopy Indications:              Surveillance: Personal history of adenomatous                            polyps on last colonoscopy 5 years ago Medicines:                Monitored Anesthesia Care Procedure:                Pre-Anesthesia Assessment:                           - Prior to the procedure, a History and Physical                            was performed, and patient medications and                            allergies were reviewed. The patient's tolerance of                            previous anesthesia was also reviewed. The risks                            and benefits of the procedure and the sedation                            options and risks were discussed with the patient.                            All questions were answered, and informed consent                            was obtained. Prior Anticoagulants: The patient has                            taken no previous anticoagulant or antiplatelet                            agents. ASA Grade Assessment: II - A patient with                            mild systemic disease. After reviewing the risks                            and benefits, the patient was deemed in                            satisfactory condition to undergo the procedure.  After obtaining informed consent, the colonoscope                            was passed under direct vision. Throughout the                            procedure, the patient's blood pressure, pulse, and                            oxygen saturations were monitored continuously. The                            Colonoscope was introduced through the anus and                            advanced to the the cecum,  identified by                            appendiceal orifice and ileocecal valve. The                            ileocecal valve, appendiceal orifice, and rectum                            were photographed. The quality of the bowel                            preparation was adequate after substantial lavage                            and suction. The colonoscopy was performed without                            difficulty. The patient tolerated the procedure                            well. Scope In: 3:31:01 PM Scope Out: 3:49:07 PM Scope Withdrawal Time: 0 hours 15 minutes 10 seconds  Total Procedure Duration: 0 hours 18 minutes 6 seconds  Findings:                 The perianal and digital rectal examinations were                            normal.                           Three sessile polyps were found in the transverse                            colon (1) and ascending colon (2). The polyps were                            6 to 8 mm in size. These polyps were removed with a  cold snare. Resection and retrieval were complete.                           Multiple medium-mouthed diverticula were found in                            the left colon. There was no evidence of                            diverticular bleeding.                           Internal hemorrhoids were found during                            retroflexion. The hemorrhoids were small and Grade                            I (internal hemorrhoids that do not prolapse).                           The exam was otherwise without abnormality on                            direct and retroflexion views. Complications:            No immediate complications. Estimated blood loss:                            None. Estimated Blood Loss:     Estimated blood loss: none. Impression:               - Three polyps in the transverse and ascending                            colon. Removed with a cold snare.                            - Moderate left colon diverticulosis.                           - Internal hemorrhoids. Recommendation:           - Repeat colonoscopy, likely in 3 years, for                            surveillance based on pathology results with a more                            extensive bowel prep.                           - Patient has a contact number available for                            emergencies. The signs and symptoms of potential  delayed complications were discussed with the                            patient. Return to normal activities tomorrow.                            Written discharge instructions were provided to the                            patient.                           - High fiber diet.                           - Continue present medications.                           - Await pathology results. Ladene Artist, MD 09/16/2020 3:55:40 PM This report has been signed electronically.

## 2020-09-16 NOTE — Progress Notes (Signed)
V/s KW I have reviewed the patient's medical history in detail and updated the computerized patient record.

## 2020-09-20 ENCOUNTER — Telehealth: Payer: Self-pay | Admitting: *Deleted

## 2020-09-20 NOTE — Telephone Encounter (Signed)
  Follow up Call-  Call back number 09/16/2020  Post procedure Call Back phone  # [phone]  Permission to leave phone message Yes  Some recent data might be hidden     Patient questions:  Do you have a fever, pain , or abdominal swelling? No. Pain Score  0 *  Have you tolerated food without any problems? Yes.    Have you been able to return to your normal activities? Yes.    Do you have any questions about your discharge instructions: Diet   No. Medications  No. Follow up visit  No.  Do you have questions or concerns about your Care? No.  Actions: * If pain score is 4 or above: No action needed, pain <4.  1. Have you developed a fever since your procedure? no  2.   Have you had an respiratory symptoms (SOB or cough) since your procedure? no  3.   Have you tested positive for COVID 19 since your procedure no  4.   Have you had any family members/close contacts diagnosed with the COVID 19 since your procedure?  no   If yes to any of these questions please route to Joylene John, RN and Joella Prince, RN

## 2020-09-27 ENCOUNTER — Other Ambulatory Visit: Payer: Self-pay

## 2020-09-27 ENCOUNTER — Encounter: Payer: Self-pay | Admitting: Gastroenterology

## 2020-09-27 ENCOUNTER — Encounter: Payer: Self-pay | Admitting: Internal Medicine

## 2020-09-27 ENCOUNTER — Ambulatory Visit (INDEPENDENT_AMBULATORY_CARE_PROVIDER_SITE_OTHER): Payer: 59 | Admitting: Internal Medicine

## 2020-09-27 VITALS — BP 122/70 | HR 70 | Temp 97.8°F | Ht 69.0 in | Wt 188.0 lb

## 2020-09-27 DIAGNOSIS — Z125 Encounter for screening for malignant neoplasm of prostate: Secondary | ICD-10-CM | POA: Diagnosis not present

## 2020-09-27 DIAGNOSIS — E7800 Pure hypercholesterolemia, unspecified: Secondary | ICD-10-CM | POA: Diagnosis not present

## 2020-09-27 DIAGNOSIS — Z0001 Encounter for general adult medical examination with abnormal findings: Secondary | ICD-10-CM | POA: Diagnosis not present

## 2020-09-27 DIAGNOSIS — J4530 Mild persistent asthma, uncomplicated: Secondary | ICD-10-CM

## 2020-09-27 DIAGNOSIS — K219 Gastro-esophageal reflux disease without esophagitis: Secondary | ICD-10-CM

## 2020-09-27 DIAGNOSIS — E119 Type 2 diabetes mellitus without complications: Secondary | ICD-10-CM | POA: Diagnosis not present

## 2020-09-27 DIAGNOSIS — Z23 Encounter for immunization: Secondary | ICD-10-CM | POA: Diagnosis not present

## 2020-09-27 DIAGNOSIS — I10 Essential (primary) hypertension: Secondary | ICD-10-CM | POA: Diagnosis not present

## 2020-09-27 MED ORDER — ALBUTEROL SULFATE HFA 108 (90 BASE) MCG/ACT IN AERS: 2.0000 | INHALATION_SPRAY | Freq: Four times a day (QID) | RESPIRATORY_TRACT | 0 refills | Status: DC | PRN

## 2020-09-27 MED ORDER — OMEPRAZOLE 20 MG PO CPDR: 20.0000 mg | DELAYED_RELEASE_CAPSULE | Freq: Every day | ORAL | 3 refills | Status: DC

## 2020-09-27 NOTE — Addendum Note (Signed)
Addended by: Lurlean Nanny on: 09/27/2020 04:33 PM   Modules accepted: Orders

## 2020-09-27 NOTE — Progress Notes (Signed)
Subjective:    Patient ID: Nathan Conway, male    DOB: [birth year], 64 y.o.   MRN: [id number]  HPI  Patient presents the clinic today for his annual exam.  He is also due to follow-up chronic conditions.  DM2: His last A1c was 6.6%, 08/2019.  He is not taking any oral diabetic medication at this time.  He does not check his sugars.  He checks his feet routinely.  His last eye exam was 8 months ago.  HTN: His BP today is 122/70.  He is taking Losartan HCT as prescribed.  ECG from 07/2020 reviewed.  HLD: His last LDL was 66,08/2019.  He denies myalgias on Simvastatin.  He does not consume a low-fat diet.  Asthma: Mild, persistent.  Managed on Breo and Albuterol.  There are no PFTs on file.  He does not follow with pulmonology.  Flu: 08/2018 Tetanus: 04/2014 Covid: Sande Rives Pneumovax: 04/2018 Prevnar: 05/2015 Zostovax: 12/2011 Shingrix: never PSA Screening: 08/2019 Colon Screening: 09/2020 Vision Screening: annually Dentist: biannually  Diet: He does eat meat. He consumes fruits and veggies daily. He does eat some fried foods. He drinks mostly water, Vitamin water. Exercise: None  Review of Systems      Past Medical History:  Diagnosis Date  . Adenomatous colon polyp   . Allergy   . Asthma   . Blood transfusion without reported diagnosis    20-30 yrs ago but not 100% sure   . Chicken pox   . Diverticulosis   . Hyperlipidemia   . Hypertension   . Personal history of kidney stones    passed stones - no surgery    Current Outpatient Medications  Medication Sig Dispense Refill  . albuterol (VENTOLIN HFA) 108 (90 Base) MCG/ACT inhaler Inhale 2 puffs into the lungs every 6 (six) hours as needed for wheezing or shortness of breath. 8 g 0  . aspirin 81 MG tablet Take 81 mg by mouth daily.    . cholecalciferol (VITAMIN D) 1000 units tablet Take 1,000 Units by mouth daily.    . Fluticasone Furoate-Vilanterol (BREO ELLIPTA) 200-25 MCG/INH AEPB Inhale 2 puffs into the lungs daily.  (Patient not taking: Reported on 09/16/2020)    . losartan-hydrochlorothiazide (HYZAAR) 100-12.5 MG tablet Take 1 tablet by mouth daily. 30 tablet 0  . simvastatin (ZOCOR) 20 MG tablet TAKE 1 TABLET BY MOUTH ONCE A DAY AT 6:00 IN THE EVENING 30 tablet 0   No current facility-administered medications for this visit.    No Known Allergies  Family History  Problem Relation Age of Onset  . Hypertension Mother   . Alcohol abuse Father   . Lung cancer Father   . Diabetes Neg Hx   . Heart disease Neg Hx   . Stroke Neg Hx   . Colon cancer Neg Hx   . Esophageal cancer Neg Hx   . Rectal cancer Neg Hx   . Stomach cancer Neg Hx   . Colon polyps Neg Hx     Social History   Socioeconomic History  . Marital status: Married    Spouse name: Not on file  . Number of children: Not on file  . Years of education: Not on file  . Highest education level: Not on file  Occupational History  . Not on file  Tobacco Use  . Smoking status: Former Smoker    Packs/day: 2.00    Types: Cigarettes    Quit date: 11/05/1977    Years since quitting: 42.9  . Smokeless  tobacco: Never Used  Substance and Sexual Activity  . Alcohol use: Not Currently    Alcohol/week: 2.0 standard drinks    Types: 2 Shots of liquor per week  . Drug use: No  . Sexual activity: Not Currently  Other Topics Concern  . Not on file  Social History Narrative  . Not on file   Social Determinants of Health   Financial Resource Strain:   . Difficulty of Paying Living Expenses: Not on file  Food Insecurity:   . Worried About Charity fundraiser in the Last Year: Not on file  . Ran Out of Food in the Last Year: Not on file  Transportation Needs:   . Lack of Transportation (Medical): Not on file  . Lack of Transportation (Non-Medical): Not on file  Physical Activity:   . Days of Exercise per Week: Not on file  . Minutes of Exercise per Session: Not on file  Stress:   . Feeling of Stress : Not on file  Social Connections:     . Frequency of Communication with Friends and Family: Not on file  . Frequency of Social Gatherings with Friends and Family: Not on file  . Attends Religious Services: Not on file  . Active Member of Clubs or Organizations: Not on file  . Attends Archivist Meetings: Not on file  . Marital Status: Not on file  Intimate Partner Violence:   . Fear of Current or Ex-Partner: Not on file  . Emotionally Abused: Not on file  . Physically Abused: Not on file  . Sexually Abused: Not on file     Constitutional: Denies fever, malaise, fatigue, headache or abrupt weight changes.  HEENT: Denies eye pain, eye redness, ear pain, ringing in the ears, wax buildup, runny nose, nasal congestion, bloody nose, or sore throat. Respiratory: Denies difficulty breathing, shortness of breath, cough or sputum production.   Cardiovascular: Denies chest pain, chest tightness, palpitations or swelling in the hands or feet.  Gastrointestinal: Pt reports reflux. Denies abdominal pain, bloating, constipation, diarrhea or blood in the stool.  GU: Denies urgency, frequency, pain with urination, burning sensation, blood in urine, odor or discharge. Musculoskeletal: Pt reports intermittent joint pain. Denies decrease in range of motion, difficulty with gait, muscle pain or joint swelling.  Skin: Denies redness, rashes, lesions or ulcercations.  Neurological: Denies dizziness, difficulty with memory, difficulty with speech or problems with balance and coordination.  Psych: Denies anxiety, depression, SI/HI.  No other specific complaints in a complete review of systems (except as listed in HPI above).  Objective:   Physical Exam  BP 122/70   Pulse 70   Temp 97.8 F (36.6 C) (Temporal)   Ht 5\' 9"  (1.753 m)   Wt 188 lb (85.3 kg)   SpO2 98%   BMI 27.76 kg/m   Wt Readings from Last 3 Encounters:  09/16/20 189 lb (85.7 kg)  09/02/20 189 lb (85.7 kg)  07/05/20 200 lb (90.7 kg)    General: Appears his  stated age, well developed, well nourished in NAD. Skin: Warm, dry and intact. No rashes noted. HEENT: Head: normal shape and size; Eyes: sclera white, no icterus, conjunctiva pink, PERRLA and EOMs intact;  Neck:  Neck supple, trachea midline. No masses, lumps or thyromegaly present.  Cardiovascular: Normal rate and rhythm. S1,S2 noted.  No murmur, rubs or gallops noted. No JVD or BLE edema. No carotid bruits noted. Pulmonary/Chest: Normal effort and positive vesicular breath sounds. No respiratory distress. No wheezes, rales  or ronchi noted.  Abdomen: Soft and nontender. Normal bowel sounds. No distention or masses noted. Liver, spleen and kidneys non palpable. Musculoskeletal: Strength 5/5 BUE/BLE. No difficulty with gait.  Neurological: Alert and oriented. Cranial nerves II-XII grossly intact. Coordination normal.  Psychiatric: Mood and affect normal. Behavior is normal. Judgment and thought content normal.    BMET    Component Value Date/Time   NA 133 (L) 07/05/2020 1402   K 4.1 07/05/2020 1402   CL 96 (L) 07/05/2020 1402   CO2 24 07/05/2020 1402   GLUCOSE 137 (H) 07/05/2020 1402   BUN 27 (H) 07/05/2020 1402   CREATININE 1.28 (H) 07/05/2020 1402   CREATININE 0.96 08/07/2019 1509   CALCIUM 9.4 07/05/2020 1402   GFRNONAA 58 (L) 07/05/2020 1402   GFRAA >60 07/05/2020 1402    Lipid Panel     Component Value Date/Time   CHOL 132 08/07/2019 1509   TRIG 76 08/07/2019 1509   HDL 50 08/07/2019 1509   CHOLHDL 2.6 08/07/2019 1509   VLDL 20.4 06/05/2017 1433   LDLCALC 66 08/07/2019 1509    CBC    Component Value Date/Time   WBC 15.5 (H) 07/05/2020 1402   RBC 4.65 07/05/2020 1402   HGB 15.2 07/05/2020 1402   HCT 41.9 07/05/2020 1402   PLT 435 (H) 07/05/2020 1402   MCV 90.1 07/05/2020 1402   MCH 32.7 07/05/2020 1402   MCHC 36.3 (H) 07/05/2020 1402   RDW 12.2 07/05/2020 1402   LYMPHSABS 1.9 07/05/2020 1402   MONOABS 1.3 (H) 07/05/2020 1402   EOSABS 0.1 07/05/2020 1402    BASOSABS 0.1 07/05/2020 1402    Hgb A1C Lab Results  Component Value Date   HGBA1C 6.6 (H) 08/07/2019            Assessment & Plan:    Preventative Health Maintenance:  Flu shot today Tetanus UTD Pneumovax UTD Prevnar today Zostovax UTD Covid UTD He will consider Shingrix vaccine Colon screening UTD Encouraged him to consume a balanced diet and exercise regimen Advised him to see an eye doctor and dentist annually Will check CBC, CMET, Lipid, A1C and PSA today  RTC in 6 months, follow up chronic conditions  Webb Silversmith, NP This visit occurred during the SARS-CoV-2 public health emergency.  Safety protocols were in place, including screening questions prior to the visit, additional usage of staff PPE, and extensive cleaning of exam room while observing appropriate contact time as indicated for disinfecting solutions.

## 2020-09-27 NOTE — Assessment & Plan Note (Signed)
A1C today No urine microalbumin secondary to ACEI therapy Encouraged him to consume a low carb diet, exercise for weight loss Encouraged routine eye exam Encouraged routine foot exams Flu and prevnar today Pneumovax and Covid UTD

## 2020-09-27 NOTE — Patient Instructions (Signed)

## 2020-09-27 NOTE — Assessment & Plan Note (Signed)
Continue Losartan HCT CMET today Reinforced DASH diet

## 2020-09-27 NOTE — Assessment & Plan Note (Signed)
CMET and Lipid profile today Consume a low fat diet Continue Simvastatin for now

## 2020-09-27 NOTE — Assessment & Plan Note (Signed)
Try to identify foods that trigger your reflux RX for Omeprazole 20 mg daily

## 2020-09-27 NOTE — Assessment & Plan Note (Signed)
Continue Breo and Albuterol Will monitor

## 2020-09-28 LAB — COMPREHENSIVE METABOLIC PANEL
ALT: 15 U/L (ref 0–53)
AST: 17 U/L (ref 0–37)
Albumin: 4.4 g/dL (ref 3.5–5.2)
Alkaline Phosphatase: 63 U/L (ref 39–117)
BUN: 20 mg/dL (ref 6–23)
CO2: 29 mEq/L (ref 19–32)
Calcium: 9.8 mg/dL (ref 8.4–10.5)
Chloride: 101 mEq/L (ref 96–112)
Creatinine, Ser: 0.81 mg/dL (ref 0.40–1.50)
GFR: 92 mL/min (ref >60.00)
Glucose, Bld: 88 mg/dL (ref 70–99)
Potassium: 3.6 mEq/L (ref 3.5–5.1)
Sodium: 138 mEq/L (ref 135–145)
Total Bilirubin: 0.4 mg/dL (ref 0.2–1.2)
Total Protein: 7 g/dL (ref 6.0–8.3)

## 2020-09-28 LAB — PSA: PSA: 1.28 ng/mL (ref 0.10–4.00)

## 2020-09-28 LAB — CBC
HCT: 39.4 % (ref 39.0–52.0)
Hemoglobin: 13.7 g/dL (ref 13.0–17.0)
MCHC: 34.7 g/dL (ref 30.0–36.0)
MCV: 89.8 fl (ref 78.0–100.0)
Platelets: 206 10*3/uL (ref 150.0–400.0)
RBC: 4.39 Mil/uL (ref 4.22–5.81)
RDW: 12.3 % (ref 11.5–15.5)
WBC: 9.2 10*3/uL (ref 4.0–10.5)

## 2020-09-28 LAB — LIPID PANEL
Cholesterol: 115 mg/dL (ref 0–200)
HDL: 44.3 mg/dL (ref >39.00)
LDL Cholesterol: 53 mg/dL (ref 0–99)
NonHDL: 70.72
Total CHOL/HDL Ratio: 3
Triglycerides: 89 mg/dL (ref 0.0–149.0)
VLDL: 17.8 mg/dL (ref 0.0–40.0)

## 2020-09-28 LAB — HEMOGLOBIN A1C: Hgb A1c MFr Bld: 6 % (ref 4.6–6.5)

## 2020-10-08 ENCOUNTER — Other Ambulatory Visit: Payer: Self-pay | Admitting: Internal Medicine

## 2021-03-31 LAB — HM DIABETES EYE EXAM

## 2021-06-22 ENCOUNTER — Other Ambulatory Visit: Payer: Self-pay | Admitting: Internal Medicine

## 2021-06-22 NOTE — Telephone Encounter (Signed)
  Notes to clinic:  Patient has appt on 10/06/2021 Review for refill until that time    Requested Prescriptions  Pending Prescriptions Disp Refills   simvastatin (ZOCOR) 20 MG tablet [Pharmacy Med Name: SIMVASTATIN 20 MG TAB] 90 tablet 2    Sig: TAKE 1 TABLET BY MOUTH ONCE A DAY AT 6:00 IN THE EVENING     Cardiovascular:  Antilipid - Statins Failed - 06/22/2021 11:32 AM      Failed - Valid encounter within last 12 months    Recent Outpatient Visits   None     Future Appointments             In 3 months Baity, Coralie Keens, NP [facility], [place] - Total Cholesterol in normal range and within 360 days    Cholesterol  Date Value Ref Range Status  09/27/2020 115 0 - 200 mg/dL Final    Comment:    ATP III Classification       Desirable:  < 200 mg/dL               Borderline High:  200 - 239 mg/dL          High:  > = 240 mg/dL          Passed - LDL in normal range and within 360 days    LDL Cholesterol (Calc)  Date Value Ref Range Status  08/07/2019 66 mg/dL (calc) Final    Comment:    Reference range: <100 . Desirable range <100 mg/dL for primary prevention;   <70 mg/dL for patients with CHD or diabetic patients  with > or = 2 CHD risk factors. Marland Kitchen LDL-C is now calculated using the Martin-Hopkins  calculation, which is a validated novel method providing  better accuracy than the Friedewald equation in the  estimation of LDL-C.  Cresenciano Genre et al. Annamaria Helling. [id number]): 2061-2068  (http://education.[facility].com/faq/FAQ164)    LDL Cholesterol  Date Value Ref Range Status  09/27/2020 53 0 - 99 mg/dL Final   Direct LDL  Date Value Ref Range Status  09/07/2016 74.0 mg/dL Final    Comment:    Optimal:  <100 mg/dLNear or Above Optimal:  100-129 mg/dLBorderline High:  130-159 mg/dLHigh:  160-189 mg/dLVery High:  >190 mg/dL          Passed - HDL in normal range and within 360 days    HDL  Date Value Ref Range Status  09/27/2020 44.30  >39.00 mg/dL Final          Passed - Triglycerides in normal range and within 360 days    Triglycerides  Date Value Ref Range Status  09/27/2020 89.0 0.0 - 149.0 mg/dL Final    Comment:    Normal:  <150 mg/dLBorderline High:  150 - 199 mg/dL          Passed - Patient is not pregnant

## 2021-06-27 ENCOUNTER — Encounter: Payer: Self-pay | Admitting: Internal Medicine

## 2021-06-27 ENCOUNTER — Ambulatory Visit: Payer: 59 | Admitting: Internal Medicine

## 2021-06-27 ENCOUNTER — Other Ambulatory Visit: Payer: Self-pay

## 2021-06-27 VITALS — BP 132/67 | HR 87 | Temp 97.1°F | Resp 20 | Ht 69.0 in | Wt 197.0 lb

## 2021-06-27 DIAGNOSIS — E119 Type 2 diabetes mellitus without complications: Secondary | ICD-10-CM

## 2021-06-27 DIAGNOSIS — E663 Overweight: Secondary | ICD-10-CM | POA: Insufficient documentation

## 2021-06-27 DIAGNOSIS — J4530 Mild persistent asthma, uncomplicated: Secondary | ICD-10-CM | POA: Diagnosis not present

## 2021-06-27 DIAGNOSIS — K219 Gastro-esophageal reflux disease without esophagitis: Secondary | ICD-10-CM

## 2021-06-27 DIAGNOSIS — Z6827 Body mass index (BMI) 27.0-27.9, adult: Secondary | ICD-10-CM | POA: Insufficient documentation

## 2021-06-27 DIAGNOSIS — I10 Essential (primary) hypertension: Secondary | ICD-10-CM

## 2021-06-27 DIAGNOSIS — E7800 Pure hypercholesterolemia, unspecified: Secondary | ICD-10-CM

## 2021-06-27 DIAGNOSIS — Z6829 Body mass index (BMI) 29.0-29.9, adult: Secondary | ICD-10-CM

## 2021-06-27 DIAGNOSIS — Z6828 Body mass index (BMI) 28.0-28.9, adult: Secondary | ICD-10-CM | POA: Insufficient documentation

## 2021-06-27 LAB — POCT GLYCOSYLATED HEMOGLOBIN (HGB A1C): Hemoglobin A1C: 6.1 % — AB (ref 4.0–5.6)

## 2021-06-27 MED ORDER — SIMVASTATIN 20 MG PO TABS: ORAL_TABLET | ORAL | 0 refills | Status: DC

## 2021-06-27 MED ORDER — ALBUTEROL SULFATE HFA 108 (90 BASE) MCG/ACT IN AERS: 1.0000 | INHALATION_SPRAY | Freq: Four times a day (QID) | RESPIRATORY_TRACT | 2 refills | Status: DC | PRN

## 2021-06-27 NOTE — Assessment & Plan Note (Signed)
Try to avoid eating late at night Encourage weight loss as this can help reduce reflux symptoms Continue Omeprazole as needed

## 2021-06-27 NOTE — Progress Notes (Signed)
Subjective:    Patient ID: Nathan [facility], male    DOB: [birth year], 65 y.o.   MRN: [id number]  HPI  Patient presents to clinic today for follow-up of chronic conditions.  DM2: His last A1c was 6%, 09/2020.  He is not taking any oral diabetic medication at this time.  He does not check his sugars.  He checks his feet routinely.  His last eye exam was 01/2021.  Flu 09/2020.  Pneumovax 04/2018.  Prevnar 09/2020.  COVID [employer].  HTN: His BP today is 132/67.  He is taking Losartan HCT as prescribed.  ECG from 07/2020 reviewed.  HLD: His last LDL was 53, triglycerides 89, 09/2020.  He denies myalgias on Simvastatin.  He does not consume a low-fat diet.  Asthma: Mild, persistent.  Managed on Breo and Albuterol.  There are no PFTs on file.  He does not follow with pulmonology.  GERD: Triggered by eating late at night.  He takes Omeprazole only as needed.  There is no upper GI on file.  Review of Systems     Past Medical History:  Diagnosis Date   Adenomatous colon polyp    Allergy    Asthma    Blood transfusion without reported diagnosis    20-30 yrs ago but not 100% sure    Chicken pox    Diverticulosis    Hyperlipidemia    Hypertension    Personal history of kidney stones    passed stones - no surgery    Current Outpatient Medications  Medication Sig Dispense Refill   albuterol (VENTOLIN HFA) 108 (90 Base) MCG/ACT inhaler Inhale 2 puffs into the lungs every 6 (six) hours as needed for wheezing or shortness of breath. 54 g 0   aspirin 81 MG tablet Take 81 mg by mouth daily.     cholecalciferol (VITAMIN D) 1000 units tablet Take 1,000 Units by mouth daily.     Fluticasone Furoate-Vilanterol (BREO ELLIPTA) 200-25 MCG/INH AEPB Inhale 2 puffs into the lungs daily.      losartan-hydrochlorothiazide (HYZAAR) 100-12.5 MG tablet Take 1 tablet by mouth daily. 90 tablet 2   omeprazole (PRILOSEC) 20 MG capsule Take 1 capsule (20 mg total) by mouth daily. 30 capsule 3   simvastatin (ZOCOR)  20 MG tablet TAKE 1 TABLET BY MOUTH ONCE A DAY AT 6:00 IN THE EVENING 90 tablet 2   No current facility-administered medications for this visit.    No Known Allergies  Family History  Problem Relation Age of Onset   Hypertension Mother    Alcohol abuse Father    Lung cancer Father    Diabetes Neg Hx    Heart disease Neg Hx    Stroke Neg Hx    Colon cancer Neg Hx    Esophageal cancer Neg Hx    Rectal cancer Neg Hx    Stomach cancer Neg Hx    Colon polyps Neg Hx     Social History   Socioeconomic History   Marital status: Married    Spouse name: Not on file   Number of children: Not on file   Years of education: Not on file   Highest education level: Not on file  Occupational History   Not on file  Tobacco Use   Smoking status: Former    Packs/day: 2.00    Types: Cigarettes    Quit date: 11/05/1977    Years since quitting: 43.6   Smokeless tobacco: Never  Substance and Sexual Activity   Alcohol use:  Not Currently    Alcohol/week: 2.0 standard drinks    Types: 2 Shots of liquor per week   Drug use: No   Sexual activity: Not Currently  Other Topics Concern   Not on file  Social History Narrative   Not on file   Social Determinants of Health   Financial Resource Strain: Not on file  Food Insecurity: Not on file  Transportation Needs: Not on file  Physical Activity: Not on file  Stress: Not on file  Social Connections: Not on file  Intimate Partner Violence: Not on file     Constitutional: Denies fever, malaise, fatigue, headache or abrupt weight changes.  HEENT: Denies eye pain, eye redness, ear pain, ringing in the ears, wax buildup, runny nose, nasal congestion, bloody nose, or sore throat. Respiratory: Denies difficulty breathing, shortness of breath, cough or sputum production.   Cardiovascular: Denies chest pain, chest tightness, palpitations or swelling in the hands or feet.  Skin: Denies redness, rashes, lesions or ulcercations.  Neurological: Denies  dizziness, difficulty with memory, difficulty with speech or problems with balance and coordination.   No other specific complaints in a complete review of systems (except as listed in HPI above).  Objective:   Physical Exam  BP 132/67 (BP Location: Left Arm, Patient Position: Sitting, Cuff Size: Normal)   Pulse 87   Temp (!) 97.1 F (36.2 C) (Temporal)   Resp 20   Ht '5\' 9"'$  (1.753 m)   Wt 197 lb (89.4 kg)   SpO2 98%   BMI 29.09 kg/m   Wt Readings from Last 3 Encounters:  09/27/20 188 lb (85.3 kg)  09/16/20 189 lb (85.7 kg)  09/02/20 189 lb (85.7 kg)    General: Appears his stated age, overweight, in NAD. Skin: Warm, dry and intact. No ulcerations noted. HEENT: Head: normal shape and size; Eyes: sclera white and EOMs intact;  Cardiovascular: Normal rate and rhythm. S1,S2 noted.  No murmur, rubs or gallops noted. No JVD or BLE edema. No carotid bruits noted. Pulmonary/Chest: Normal effort and positive vesicular breath sounds. No respiratory distress. No wheezes, rales or ronchi noted.  Abdomen: Soft and nontender.  Musculoskeletal: No difficulty with gait.  Neurological: Alert and oriented.    BMET    Component Value Date/Time   NA 138 09/27/2020 1536   K 3.6 09/27/2020 1536   CL 101 09/27/2020 1536   CO2 29 09/27/2020 1536   GLUCOSE 88 09/27/2020 1536   BUN 20 09/27/2020 1536   CREATININE 0.81 09/27/2020 1536   CREATININE 0.96 08/07/2019 1509   CALCIUM 9.8 09/27/2020 1536   GFRNONAA 58 (L) 07/05/2020 1402   GFRAA >60 07/05/2020 1402    Lipid Panel     Component Value Date/Time   CHOL 115 09/27/2020 1536   TRIG 89.0 09/27/2020 1536   HDL 44.30 09/27/2020 1536   CHOLHDL 3 09/27/2020 1536   VLDL 17.8 09/27/2020 1536   LDLCALC 53 09/27/2020 1536   LDLCALC 66 08/07/2019 1509    CBC    Component Value Date/Time   WBC 9.2 09/27/2020 1536   RBC 4.39 09/27/2020 1536   HGB 13.7 09/27/2020 1536   HCT 39.4 09/27/2020 1536   PLT 206.0 09/27/2020 1536   MCV 89.8  09/27/2020 1536   MCH 32.7 07/05/2020 1402   MCHC 34.7 09/27/2020 1536   RDW 12.3 09/27/2020 1536   LYMPHSABS 1.9 07/05/2020 1402   MONOABS 1.3 (H) 07/05/2020 1402   EOSABS 0.1 07/05/2020 1402   BASOSABS 0.1 07/05/2020 1402  Hgb A1C Lab Results  Component Value Date   HGBA1C 6.0 09/27/2020           Assessment & Plan:   Webb Silversmith, NP This visit occurred during the SARS-CoV-2 public health emergency.  Safety protocols were in place, including screening questions prior to the visit, additional usage of staff PPE, and extensive cleaning of exam room while observing appropriate contact time as indicated for disinfecting solutions.

## 2021-06-27 NOTE — Addendum Note (Signed)
Addended by: Wilson Singer on: 06/27/2021 03:29 PM   Modules accepted: Orders

## 2021-06-27 NOTE — Assessment & Plan Note (Signed)
Encouraged him to consume a low-fat diet Continue Simvastatin, refilled today

## 2021-06-27 NOTE — Assessment & Plan Note (Signed)
Encourage diet and exercise weight loss 

## 2021-06-27 NOTE — Patient Instructions (Signed)
https://www.diabeteseducator.org/docs/default-source/living-with-diabetes/conquering-the-grocery-store-v1.pdf?sfvrsn=4">  Carbohydrate Counting for Diabetes Mellitus, Adult Carbohydrate counting is a method of keeping track of how many carbohydrates you eat. Eating carbohydrates naturally increases the amount of sugar (glucose) in the blood. Counting how many carbohydrates you eat improves your bloodglucose control, which helps you manage your diabetes. It is important to know how many carbohydrates you can safely have in each meal. This is different for every person. A dietitian can help you make a meal plan and calculate how many carbohydrates you should have at each meal andsnack. What foods contain carbohydrates? Carbohydrates are found in the following foods: Grains, such as breads and cereals. Dried beans and soy products. Starchy vegetables, such as potatoes, peas, and corn. Fruit and fruit juices. Milk and yogurt. Sweets and snack foods, such as cake, cookies, candy, chips, and soft drinks. How do I count carbohydrates in foods? There are two ways to count carbohydrates in food. You can read food labels or learn standard serving sizes of foods. You can use either of the methods or acombination of both. Using the Nutrition Facts label The Nutrition Facts list is included on the labels of almost all packaged foods and beverages in the [place] It includes: The serving size. Information about nutrients in each serving, including the grams (g) of carbohydrate per serving. To use the Nutrition Facts: Decide how many servings you will have. Multiply the number of servings by the number of carbohydrates per serving. The resulting number is the total amount of carbohydrates that you will be having. Learning the standard serving sizes of foods When you eat carbohydrate foods that are not packaged or do not include Nutrition Facts on the label, you need to measure the servings in order to count the  amount of carbohydrates. Measure the foods that you will eat with a food scale or measuring cup, if needed. Decide how many standard-size servings you will eat. Multiply the number of servings by 15. For foods that contain carbohydrates, one serving equals 15 g of carbohydrates. For example, if you eat 2 cups or 10 oz (300 g) of strawberries, you will have eaten 2 servings and 30 g of carbohydrates (2 servings x 15 g = 30 g). For foods that have more than one food mixed, such as soups and casseroles, you must count the carbohydrates in each food that is included. The following list contains standard serving sizes of common carbohydrate-rich foods. Each of these servings has about 15 g of carbohydrates: 1 slice of bread. 1 six-inch (15 cm) tortilla. ? cup or 2 oz (53 g) cooked rice or pasta.  cup or 3 oz (85 g) cooked or canned, drained and rinsed beans or lentils.  cup or 3 oz (85 g) starchy vegetable, such as peas, corn, or squash.  cup or 4 oz (120 g) hot cereal.  cup or 3 oz (85 g) boiled or mashed potatoes, or  or 3 oz (85 g) of a large baked potato.  cup or 4 fl oz (118 mL) fruit juice. 1 cup or 8 fl oz (237 mL) milk. 1 small or 4 oz (106 g) apple.  or 2 oz (63 g) of a medium banana. 1 cup or 5 oz (150 g) strawberries. 3 cups or 1 oz (24 g) popped popcorn. What is an example of carbohydrate counting? To calculate the number of carbohydrates in this sample meal, follow the stepsshown below. Sample meal 3 oz (85 g) chicken breast. ? cup or 4 oz (106 g) brown rice.    cup or 3 oz (85 g) corn. 1 cup or 8 fl oz (237 mL) milk. 1 cup or 5 oz (150 g) strawberries with sugar-free whipped topping. Carbohydrate calculation Identify the foods that contain carbohydrates: Rice. Corn. Milk. Strawberries. Calculate how many servings you have of each food: 2 servings rice. 1 serving corn. 1 serving milk. 1 serving strawberries. Multiply each number of servings by 15 g: 2 servings  rice x 15 g = 30 g. 1 serving corn x 15 g = 15 g. 1 serving milk x 15 g = 15 g. 1 serving strawberries x 15 g = 15 g. Add together all of the amounts to find the total grams of carbohydrates eaten: 30 g + 15 g + 15 g + 15 g = 75 g of carbohydrates total. What are tips for following this plan? Shopping Develop a meal plan and then make a shopping list. Buy fresh and frozen vegetables, fresh and frozen fruit, dairy, eggs, beans, lentils, and whole grains. Look at food labels. Choose foods that have more fiber and less sugar. Avoid processed foods and foods with added sugars. Meal planning Aim to have the same amount of carbohydrates at each meal and for each snack time. Plan to have regular, balanced meals and snacks. Where to find more information American Diabetes Association: www.diabetes.org Centers for Disease Control and Prevention: www.cdc.gov Summary Carbohydrate counting is a method of keeping track of how many carbohydrates you eat. Eating carbohydrates naturally increases the amount of sugar (glucose) in the blood. Counting how many carbohydrates you eat improves your blood glucose control, which helps you manage your diabetes. A dietitian can help you make a meal plan and calculate how many carbohydrates you should have at each meal and snack. This information is not intended to replace advice given to you by your health care provider. Make sure you discuss any questions you have with your healthcare provider. Document Revised: 10/22/2019 Document Reviewed: 10/23/2019 Elsevier Patient Education  2021 Elsevier Inc.  

## 2021-06-27 NOTE — Assessment & Plan Note (Signed)
Controlled on Losartan HCT Reinforced DASH diet and exercise weight loss

## 2021-06-27 NOTE — Assessment & Plan Note (Signed)
Continue Breo and Albuterol Will monitor

## 2021-06-27 NOTE — Assessment & Plan Note (Signed)
POCT A1c today No urine microalbumin secondary to ARB therapy Encouraged him to consume a low-carb diet and exercise weight loss Will request copy of his eye exam Encourage routine foot exams Encouraged him to get a flu shot in the fall Pneumovax and Prevnar UTD Encouraged him to get COVID booster

## 2021-07-21 ENCOUNTER — Other Ambulatory Visit: Payer: Self-pay | Admitting: Internal Medicine

## 2021-08-21 ENCOUNTER — Other Ambulatory Visit: Payer: Self-pay | Admitting: Internal Medicine

## 2021-08-21 NOTE — Telephone Encounter (Signed)
Call to pharmacy- Rx filled 07/21/21 #90- too soon for RF. Requested Prescriptions  Pending Prescriptions Disp Refills  . losartan-hydrochlorothiazide (HYZAAR) 100-12.5 MG tablet [Pharmacy Med Name: LOSARTAN POTASSIUM-HCTZ 100-12.5 MG] 90 tablet 0    Sig: TAKE 1 TABLET BY MOUTH ONCE A DAY. PER MD MUST SCHEDULE PHYSICAL EXAM.     Cardiovascular: ARB + Diuretic Combos Failed - 08/21/2021 10:09 AM      Failed - K in normal range and within 180 days    Potassium  Date Value Ref Range Status  09/27/2020 3.6 3.5 - 5.1 mEq/L Final         Failed - Na in normal range and within 180 days    Sodium  Date Value Ref Range Status  09/27/2020 138 135 - 145 mEq/L Final         Failed - Cr in normal range and within 180 days    Creat  Date Value Ref Range Status  08/07/2019 0.96 0.70 - 1.25 mg/dL Final    Comment:    For patients >17 years of age, the reference limit for Creatinine is approximately 13% higher for people identified as African-American. .    Creatinine, Ser  Date Value Ref Range Status  09/27/2020 0.81 0.40 - 1.50 mg/dL Final         Failed - Ca in normal range and within 180 days    Calcium  Date Value Ref Range Status  09/27/2020 9.8 8.4 - 10.5 mg/dL Final         Passed - Patient is not pregnant      Passed - Last BP in normal range    BP Readings from Last 1 Encounters:  06/27/21 132/67         Passed - Valid encounter within last 6 months    Recent Outpatient Visits          1 month ago Overweight with body mass index (BMI) of 29 to 29.9 in adult   [place], NP      Future Appointments            In 1 month [place], Coralie Keens, NP [facility], [facility]

## 2021-09-20 ENCOUNTER — Other Ambulatory Visit: Payer: Self-pay | Admitting: Internal Medicine

## 2021-09-20 NOTE — Telephone Encounter (Signed)
Requested medication (s) are due for refill today -no  Requested medication (s) are on the active medication list -yes  Future visit scheduled -yes  Last refill: 07/21/21 #90  Notes to clinic: Request RF: fails lab protocol, patient should have enough until appointment  Requested Prescriptions  Pending Prescriptions Disp Refills   losartan-hydrochlorothiazide (HYZAAR) 100-12.5 MG tablet [Pharmacy Med Name: LOSARTAN POTASSIUM-HCTZ 100-12.5 MG] 90 tablet 0    Sig: TAKE 1 TABLET BY MOUTH ONCE A DAY. PER MD MUST SCHEDULE PHYSICAL EXAM.     Cardiovascular: ARB + Diuretic Combos Failed - 09/20/2021 10:56 AM      Failed - K in normal range and within 180 days    Potassium  Date Value Ref Range Status  09/27/2020 3.6 3.5 - 5.1 mEq/L Final          Failed - Na in normal range and within 180 days    Sodium  Date Value Ref Range Status  09/27/2020 138 135 - 145 mEq/L Final          Failed - Cr in normal range and within 180 days    Creat  Date Value Ref Range Status  08/07/2019 0.96 0.70 - 1.25 mg/dL Final    Comment:    For patients >66 years of age, the reference limit for Creatinine is approximately 13% higher for people identified as African-American. .    Creatinine, Ser  Date Value Ref Range Status  09/27/2020 0.81 0.40 - 1.50 mg/dL Final          Failed - Ca in normal range and within 180 days    Calcium  Date Value Ref Range Status  09/27/2020 9.8 8.4 - 10.5 mg/dL Final          Passed - Patient is not pregnant      Passed - Last BP in normal range    BP Readings from Last 1 Encounters:  06/27/21 132/67          Passed - Valid encounter within last 6 months    Recent Outpatient Visits           2 months ago Overweight with body mass index (BMI) of 29 to 29.9 in adult   [place], Nathan Keens, NP       Future Appointments             In 2 weeks [employer], Nathan Keens, NP [facility], [facility]            Signed  Prescriptions Disp Refills   simvastatin (ZOCOR) 20 MG tablet 90 tablet 0    Sig: TAKE 1 TABLET BY MOUTH ONCE A DAY AT 6 PM     Cardiovascular:  Antilipid - Statins Passed - 09/20/2021 10:56 AM      Passed - Total Cholesterol in normal range and within 360 days    Cholesterol  Date Value Ref Range Status  09/27/2020 115 0 - 200 mg/dL Final    Comment:    ATP III Classification       Desirable:  < 200 mg/dL               Borderline High:  200 - 239 mg/dL          High:  > = 240 mg/dL          Passed - LDL in normal range and within 360 days    LDL Cholesterol (Calc)  Date Value Ref Range Status  08/07/2019 66 mg/dL (calc) Final    Comment:    Reference range: <100 . Desirable range <100 mg/dL for primary prevention;   <70 mg/dL for patients with CHD or diabetic patients  with > or = 2 CHD risk factors. Marland Kitchen LDL-C is now calculated using the Martin-Hopkins  calculation, which is a validated novel method providing  better accuracy than the Friedewald equation in the  estimation of LDL-C.  Cresenciano Genre et al. Annamaria Helling. 7253;664(40): 2061-2068  (http://education.[employer].com/faq/FAQ164)    LDL Cholesterol  Date Value Ref Range Status  09/27/2020 53 0 - 99 mg/dL Final   Direct LDL  Date Value Ref Range Status  09/07/2016 74.0 mg/dL Final    Comment:    Optimal:  <100 mg/dLNear or Above Optimal:  100-129 mg/dLBorderline High:  130-159 mg/dLHigh:  160-189 mg/dLVery High:  >190 mg/dL          Passed - HDL in normal range and within 360 days    HDL  Date Value Ref Range Status  09/27/2020 44.30 >39.00 mg/dL Final          Passed - Triglycerides in normal range and within 360 days    Triglycerides  Date Value Ref Range Status  09/27/2020 89.0 0.0 - 149.0 mg/dL Final    Comment:    Normal:  <150 mg/dLBorderline High:  150 - 199 mg/dL          Passed - Patient is not pregnant      Passed - Valid encounter within last 12 months    Recent Outpatient Visits            2 months ago Overweight with body mass index (BMI) of 29 to 29.9 in adult   [facility], Nathan Keens, NP       Future Appointments             In 2 weeks [facility], Nathan Keens, NP [facility], [facility]               Requested Prescriptions  Pending Prescriptions Disp Refills   losartan-hydrochlorothiazide (HYZAAR) 100-12.5 MG tablet [Pharmacy Med Name: LOSARTAN POTASSIUM-HCTZ 100-12.5 MG] 90 tablet 0    Sig: TAKE 1 TABLET BY MOUTH ONCE A DAY. PER MD MUST SCHEDULE PHYSICAL EXAM.     Cardiovascular: ARB + Diuretic Combos Failed - 09/20/2021 10:56 AM      Failed - K in normal range and within 180 days    Potassium  Date Value Ref Range Status  09/27/2020 3.6 3.5 - 5.1 mEq/L Final          Failed - Na in normal range and within 180 days    Sodium  Date Value Ref Range Status  09/27/2020 138 135 - 145 mEq/L Final          Failed - Cr in normal range and within 180 days    Creat  Date Value Ref Range Status  08/07/2019 0.96 0.70 - 1.25 mg/dL Final    Comment:    For patients >63 years of age, the reference limit for Creatinine is approximately 13% higher for people identified as African-American. .    Creatinine, Ser  Date Value Ref Range Status  09/27/2020 0.81 0.40 - 1.50 mg/dL Final          Failed - Ca in normal range and within 180 days    Calcium  Date Value Ref Range Status  09/27/2020 9.8 8.4 - 10.5 mg/dL Final  Passed - Patient is not pregnant      Passed - Last BP in normal range    BP Readings from Last 1 Encounters:  06/27/21 132/67          Passed - Valid encounter within last 6 months    Recent Outpatient Visits           2 months ago Overweight with body mass index (BMI) of 29 to 29.9 in adult   [place], NP       Future Appointments             In 2 weeks [place], Nathan Keens, NP [facility], [facility]            Signed Prescriptions Disp Refills    simvastatin (ZOCOR) 20 MG tablet 90 tablet 0    Sig: TAKE 1 TABLET BY MOUTH ONCE A DAY AT 6 PM     Cardiovascular:  Antilipid - Statins Passed - 09/20/2021 10:56 AM      Passed - Total Cholesterol in normal range and within 360 days    Cholesterol  Date Value Ref Range Status  09/27/2020 115 0 - 200 mg/dL Final    Comment:    ATP III Classification       Desirable:  < 200 mg/dL               Borderline High:  200 - 239 mg/dL          High:  > = 240 mg/dL          Passed - LDL in normal range and within 360 days    LDL Cholesterol (Calc)  Date Value Ref Range Status  08/07/2019 66 mg/dL (calc) Final    Comment:    Reference range: <100 . Desirable range <100 mg/dL for primary prevention;   <70 mg/dL for patients with CHD or diabetic patients  with > or = 2 CHD risk factors. Marland Kitchen LDL-C is now calculated using the Martin-Hopkins  calculation, which is a validated novel method providing  better accuracy than the Friedewald equation in the  estimation of LDL-C.  Cresenciano Genre et al. Annamaria Helling. 6213;086(57): 2061-2068  (http://education.[facility].com/faq/FAQ164)    LDL Cholesterol  Date Value Ref Range Status  09/27/2020 53 0 - 99 mg/dL Final   Direct LDL  Date Value Ref Range Status  09/07/2016 74.0 mg/dL Final    Comment:    Optimal:  <100 mg/dLNear or Above Optimal:  100-129 mg/dLBorderline High:  130-159 mg/dLHigh:  160-189 mg/dLVery High:  >190 mg/dL          Passed - HDL in normal range and within 360 days    HDL  Date Value Ref Range Status  09/27/2020 44.30 >39.00 mg/dL Final          Passed - Triglycerides in normal range and within 360 days    Triglycerides  Date Value Ref Range Status  09/27/2020 89.0 0.0 - 149.0 mg/dL Final    Comment:    Normal:  <150 mg/dLBorderline High:  150 - 199 mg/dL          Passed - Patient is not pregnant      Passed - Valid encounter within last 12 months    Recent Outpatient Visits           2 months ago Overweight  with body mass index (BMI) of 29 to 29.9 in adult   [facility] [place], [place]  W, NP       Future Appointments             In 2 weeks [place], Nathan Keens, NP [facility], [facility]

## 2021-09-20 NOTE — Telephone Encounter (Signed)
Requested Prescriptions  Pending Prescriptions Disp Refills  . losartan-hydrochlorothiazide (HYZAAR) 100-12.5 MG tablet [Pharmacy Med Name: LOSARTAN POTASSIUM-HCTZ 100-12.5 MG] 90 tablet 0    Sig: TAKE 1 TABLET BY MOUTH ONCE A DAY. PER MD MUST SCHEDULE PHYSICAL EXAM.     Cardiovascular: ARB + Diuretic Combos Failed - 09/20/2021 10:56 AM      Failed - K in normal range and within 180 days    Potassium  Date Value Ref Range Status  09/27/2020 3.6 3.5 - 5.1 mEq/L Final         Failed - Na in normal range and within 180 days    Sodium  Date Value Ref Range Status  09/27/2020 138 135 - 145 mEq/L Final         Failed - Cr in normal range and within 180 days    Creat  Date Value Ref Range Status  08/07/2019 0.96 0.70 - 1.25 mg/dL Final    Comment:    For patients >15 years of age, the reference limit for Creatinine is approximately 13% higher for people identified as African-American. .    Creatinine, Ser  Date Value Ref Range Status  09/27/2020 0.81 0.40 - 1.50 mg/dL Final         Failed - Ca in normal range and within 180 days    Calcium  Date Value Ref Range Status  09/27/2020 9.8 8.4 - 10.5 mg/dL Final         Passed - Patient is not pregnant      Passed - Last BP in normal range    BP Readings from Last 1 Encounters:  06/27/21 132/67         Passed - Valid encounter within last 6 months    Recent Outpatient Visits          2 months ago Overweight with body mass index (BMI) of 29 to 29.9 in adult   [place], NP      Future Appointments            In 2 weeks Garnette [name], Coralie Keens, NP [facility], [place]           . simvastatin (ZOCOR) 20 MG tablet [Pharmacy Med Name: SIMVASTATIN 20 MG TAB] 90 tablet 0    Sig: TAKE 1 TABLET BY MOUTH ONCE A DAY AT 6 PM     Cardiovascular:  Antilipid - Statins Passed - 09/20/2021 10:56 AM      Passed - Total Cholesterol in normal range and within 360 days    Cholesterol  Date  Value Ref Range Status  09/27/2020 115 0 - 200 mg/dL Final    Comment:    ATP III Classification       Desirable:  < 200 mg/dL               Borderline High:  200 - 239 mg/dL          High:  > = 240 mg/dL         Passed - LDL in normal range and within 360 days    LDL Cholesterol (Calc)  Date Value Ref Range Status  08/07/2019 66 mg/dL (calc) Final    Comment:    Reference range: <100 . Desirable range <100 mg/dL for primary prevention;   <70 mg/dL for patients with CHD or diabetic patients  with > or = 2 CHD risk factors. Marland Kitchen LDL-C is now calculated using the Martin-Hopkins  calculation, which  is a validated novel method providing  better accuracy than the Friedewald equation in the  estimation of LDL-C.  Cresenciano Genre et al. Annamaria Helling. 2585;277(82): 2061-2068  (http://education.[employer].com/faq/FAQ164)    LDL Cholesterol  Date Value Ref Range Status  09/27/2020 53 0 - 99 mg/dL Final   Direct LDL  Date Value Ref Range Status  09/07/2016 74.0 mg/dL Final    Comment:    Optimal:  <100 mg/dLNear or Above Optimal:  100-129 mg/dLBorderline High:  130-159 mg/dLHigh:  160-189 mg/dLVery High:  >190 mg/dL         Passed - HDL in normal range and within 360 days    HDL  Date Value Ref Range Status  09/27/2020 44.30 >39.00 mg/dL Final         Passed - Triglycerides in normal range and within 360 days    Triglycerides  Date Value Ref Range Status  09/27/2020 89.0 0.0 - 149.0 mg/dL Final    Comment:    Normal:  <150 mg/dLBorderline High:  150 - 199 mg/dL         Passed - Patient is not pregnant      Passed - Valid encounter within last 12 months    Recent Outpatient Visits          2 months ago Overweight with body mass index (BMI) of 29 to 29.9 in adult   [place], Coralie Keens, NP      Future Appointments            In 2 weeks Garnette [name], Coralie Keens, NP [facility]

## 2021-10-06 ENCOUNTER — Encounter: Payer: Self-pay | Admitting: Internal Medicine

## 2021-10-06 ENCOUNTER — Other Ambulatory Visit: Payer: Self-pay

## 2021-10-06 ENCOUNTER — Ambulatory Visit (INDEPENDENT_AMBULATORY_CARE_PROVIDER_SITE_OTHER): Payer: 59 | Admitting: Internal Medicine

## 2021-10-06 VITALS — BP 148/78 | HR 78 | Resp 16 | Ht 69.0 in | Wt 195.8 lb

## 2021-10-06 DIAGNOSIS — Z125 Encounter for screening for malignant neoplasm of prostate: Secondary | ICD-10-CM

## 2021-10-06 DIAGNOSIS — Z6828 Body mass index (BMI) 28.0-28.9, adult: Secondary | ICD-10-CM

## 2021-10-06 DIAGNOSIS — E119 Type 2 diabetes mellitus without complications: Secondary | ICD-10-CM

## 2021-10-06 DIAGNOSIS — E663 Overweight: Secondary | ICD-10-CM

## 2021-10-06 DIAGNOSIS — F39 Unspecified mood [affective] disorder: Secondary | ICD-10-CM | POA: Insufficient documentation

## 2021-10-06 DIAGNOSIS — Z0001 Encounter for general adult medical examination with abnormal findings: Secondary | ICD-10-CM

## 2021-10-06 MED ORDER — OMEPRAZOLE 20 MG PO CPDR: 20.0000 mg | DELAYED_RELEASE_CAPSULE | Freq: Every day | ORAL | 1 refills | Status: DC | PRN

## 2021-10-06 MED ORDER — ALBUTEROL SULFATE HFA 108 (90 BASE) MCG/ACT IN AERS
1.0000 | INHALATION_SPRAY | Freq: Four times a day (QID) | RESPIRATORY_TRACT | 2 refills | Status: DC | PRN
Start: 2021-10-06 — End: 2022-07-26

## 2021-10-06 NOTE — Patient Instructions (Signed)
Citalopram Capsules What is this medication? CITALOPRAM (sye TAL oh pram) treats depression. It increases the amount of serotonin in the brain, a hormone that helps regulate mood. It belongs to a group of medications called SSRIs. This medicine may be used for other purposes; ask your health care provider or pharmacist if you have questions. What should I tell my care team before I take this medication? They need to know if you have any of these conditions: Bipolar disorder or a family history of bipolar disorder Bleeding disorders Glaucoma Heart disease History of irregular heartbeat Kidney disease Liver disease Low levels of magnesium or potassium in the blood Receiving electroconvulsive therapy Seizures Suicidal thoughts, plans, or attempt; a previous suicide attempt by you or a family member Take medications that treat or prevent blood clots Thyroid disease An unusual or allergic reaction to citalopram, escitalopram, other medications, foods, dyes, or preservatives Pregnant or trying to become pregnant Breast-feeding How should I use this medication? Take this medication by mouth with water. Take it as directed on the prescription label at the same time every day. Do not cut, crush or chew this medication. Swallow the capsules whole. You can take it with or without food. Keep taking it unless your care team tells you to stop. Stopping this medication too quickly may cause serious side effects or your condition may worsen. A special MedGuide will be given to you by the pharmacist with each prescription and refill. Be sure to read this information carefully each time. Talk to your care team about the use of this medication in children. Special care may be needed. Patients over 21 years old may have a stronger reaction and need a smaller dose. Overdosage: If you think you have taken too much of this medicine contact a poison control center or emergency room at once. NOTE: This medicine  is only for you. Do not share this medicine with others. What if I miss a dose? If you miss a dose, take it as soon as you can. If it is almost time for your next dose, take only that dose. Do not take double or extra doses. What may interact with this medication? Do not take this medication with any of the following: Certain medications for fungal infections like fluconazole, itraconazole, ketoconazole, posaconazole, voriconazole Cisapride Dronedarone Escitalopram Linezolid MAOIs like Carbex, Eldepryl, Marplan, Nardil, and Parnate Methylene blue (injected into a vein) Pimozide Thioridazine This medication may also interact with the following: Alcohol Amphetamines Aspirin and aspirin-like medications Carbamazepine Certain medications for depression, anxiety, or psychotic disturbances Certain medications for infections like chloroquine, clarithromycin, erythromycin, furazolidone, isoniazid, pentamidine Certain medications for migraine headaches like almotriptan, eletriptan, frovatriptan, naratriptan, rizatriptan, sumatriptan, zolmitriptan Certain medications for sleep Certain medications that treat or prevent blood clots like dalteparin, enoxaparin, warfarin Cimetidine Diuretics Dofetilide Fentanyl Lithium Methadone Metoprolol NSAIDs, medications for pain and inflammation, like ibuprofen or naproxen Omeprazole Other medications that prolong the QT interval (cause an abnormal heart rhythm) Procarbazine Rasagiline Supplements like St. John's wort, kava kava, valerian Tramadol Tryptophan Ziprasidone This list may not describe all possible interactions. Give your health care provider a list of all the medicines, herbs, non-prescription drugs, or dietary supplements you use. Also tell them if you smoke, drink alcohol, or use illegal drugs. Some items may interact with your medicine. What should I watch for while using this medication? Tell your care team if your symptoms do not  get better or if they get worse. Visit your care team for regular checks on your  progress. Because it may take several weeks to see the full effects of this medication, it is important to continue your treatment as prescribed. Watch for new or worsening thoughts of suicide or depression. This includes sudden changes in mood, behavior, or thoughts. These changes can happen at any time but are more common in the beginning of treatment or after a change in dose. Call your care team right away if you experience these thoughts or worsening depression. Manic episodes may happen in patients with bipolar disorder who take this medication. Watch for changes in feelings or behaviors such as feeling anxious, nervous, agitated, panicky, irritable, hostile, aggressive, impulsive, severely restless, overly excited and hyperactive, or trouble sleeping. These symptoms can happen at anytime but are more common in the beginning of treatment or after a change in dose. Call you care team right away if you notice any of these symptoms. You may get drowsy or dizzy. Do not drive, use machinery, or do anything that needs mental alertness until you know how this medication affects you. Do not stand or sit up quickly, especially if you are an older patient. This reduces the risk of dizzy or fainting spells. Alcohol may interfere with the effect of this medication. Avoid alcoholic drinks. Your mouth may get dry. Chewing sugarless gum or sucking hard candy, and drinking plenty of water may help. Contact your care team if the problem does not go away or is severe. What side effects may I notice from receiving this medication? Side effects that you should report to your care team as soon as possible: Allergic reactions--skin rash, itching, hives, swelling of the face, lips, tongue, or throat Bleeding--bloody or black, tar-like stools, red or dark brown urine, vomiting blood or brown material that looks like coffee grounds, small, red or  purple spots on skin, unusual bleeding or bruising Heart rhythm changes--fast or irregular heartbeat, dizziness, feeling faint or lightheaded, chest pain, trouble breathing Low sodium level--muscle weakness, fatigue, dizziness, headache, confusion Serotonin syndrome--irritability, confusion, fast or irregular heartbeat, muscle stiffness, twitching muscles, sweating, high fever, seizure, chills, vomiting, diarrhea Sudden eye pain or change in vision such as blurry vision, seeing halos around lights, vision loss Thoughts of suicide or self-harm, worsening mood, feelings of depression Side effects that usually do not require medical attention (report to your care team if they continue or are bothersome): Change in sex drive or performance Diarrhea Dry mouth Excessive sweating Nausea Tremors or shaking Upset stomach This list may not describe all possible side effects. Call your doctor for medical advice about side effects. You may report side effects to FDA at 1-800-FDA-1088. Where should I keep my medication? Keep out of the reach of children and pets. Store at room temperature between 20 and 25 degrees C (68 and 77 degrees F). Get rid of any unused medication after the expiration date. To get rid of medications that are no longer needed or have expired: Take the medication to a medication take-back program. Check with your pharmacy or law enforcement to find a location. If you cannot return the medication, check the label or package insert to see if the medication should be thrown out in the garbage or flushed down the toilet. If you are not sure, ask your care team. If it is safe to put it in the trash, empty the medication out of the container. Mix the medication with cat litter, dirt, coffee grounds, or other unwanted substance. Seal the mixture in a bag or container. Put it in the  trash. NOTE: This sheet is a summary. It may not cover all possible information. If you have questions about this  medicine, talk to your doctor, pharmacist, or health care provider.  2022 Elsevier/Gold Standard (2021-01-01 00:00:00)

## 2021-10-06 NOTE — Assessment & Plan Note (Signed)
Mainly irritability and anger outburst Discussed treatment with use of Citalopram Discussed side effects He will consider this and let me know what he would like to do

## 2021-10-06 NOTE — Assessment & Plan Note (Signed)
Encouraged diet and exercise for weight loss ?

## 2021-10-06 NOTE — Progress Notes (Signed)
Subjective:    Patient ID: Nathan Conway, male    DOB: 10-Jun-1956, 65 y.o.   MRN: [id number]  HPI  Patient presents the clinic today for his annual exam.  Flu: 09/2021 Tetanus: 04/2014 COVID: Alphonsa Overall x1 Pneumovax: 04/2018 Prevnar: 09/2020 Zostavax: 2013 Shingrix: Never PSA screening: 09/2020 Colon screening: 09/2020 Vision screening:annually Dentist: dentist  Diet: He does eat meat. He consumes fruits and veggies. He does eat some fried foods. He drinks mostly water, protein shake, vitamin water. Exercise: Walking  Review of Systems     Past Medical History:  Diagnosis Date   Adenomatous colon polyp    Allergy    Asthma    Blood transfusion without reported diagnosis    20-30 yrs ago but not 100% sure    Chicken pox    Diverticulosis    Hyperlipidemia    Hypertension    Personal history of kidney stones    passed stones - no surgery    Current Outpatient Medications  Medication Sig Dispense Refill   albuterol (PROVENTIL HFA) 108 (90 Base) MCG/ACT inhaler Inhale 1-2 puffs into the lungs every 6 (six) hours as needed for wheezing or shortness of breath. 18 g 2   aspirin 81 MG tablet Take 81 mg by mouth daily.     cholecalciferol (VITAMIN D) 1000 units tablet Take 1,000 Units by mouth daily.     fluticasone furoate-vilanterol (BREO ELLIPTA) 200-25 MCG/INH AEPB Inhale 2 puffs into the lungs daily.      losartan-hydrochlorothiazide (HYZAAR) 100-12.5 MG tablet TAKE 1 TABLET BY MOUTH ONCE A DAY. PER MD MUST SCHEDULE PHYSICAL EXAM. 90 tablet 0   omeprazole (PRILOSEC) 20 MG capsule Take 1 capsule (20 mg total) by mouth daily. (Patient taking differently: Take 20 mg by mouth daily as needed.) 30 capsule 3   simvastatin (ZOCOR) 20 MG tablet TAKE 1 TABLET BY MOUTH ONCE A DAY AT 6 PM 90 tablet 0   No current facility-administered medications for this visit.    No Known Allergies  Family History  Problem Relation Age of Onset   Hypertension Mother    Alcohol abuse Father     Lung cancer Father    Diabetes Neg Hx    Heart disease Neg Hx    Stroke Neg Hx    Colon cancer Neg Hx    Esophageal cancer Neg Hx    Rectal cancer Neg Hx    Stomach cancer Neg Hx    Colon polyps Neg Hx     Social History   Socioeconomic History   Marital status: Married    Spouse name: Not on file   Number of children: Not on file   Years of education: Not on file   Highest education level: Not on file  Occupational History   Not on file  Tobacco Use   Smoking status: Former    Packs/day: 2.00    Types: Cigarettes    Quit date: 11/05/1977    Years since quitting: 43.9   Smokeless tobacco: Never  Substance and Sexual Activity   Alcohol use: Not Currently    Alcohol/week: 2.0 standard drinks    Types: 2 Shots of liquor per week   Drug use: No   Sexual activity: Not Currently  Other Topics Concern   Not on file  Social History Narrative   Not on file   Social Determinants of Health   Financial Resource Strain: Not on file  Food Insecurity: Not on file  Transportation Needs: Not on file  Physical Activity: Not on file  Stress: Not on file  Social Connections: Not on file  Intimate Partner Violence: Not on file     Constitutional: Denies fever, malaise, fatigue, headache or abrupt weight changes.  HEENT: Denies eye pain, eye redness, ear pain, ringing in the ears, wax buildup, runny nose, nasal congestion, bloody nose, or sore throat. Respiratory: Denies difficulty breathing, shortness of breath, cough or sputum production.   Cardiovascular: Denies chest pain, chest tightness, palpitations or swelling in the hands or feet.  Gastrointestinal: Denies abdominal pain, bloating, constipation, diarrhea or blood in the stool.  GU: Denies urgency, frequency, pain with urination, burning sensation, blood in urine, odor or discharge. Musculoskeletal: Denies decrease in range of motion, difficulty with gait, muscle pain or joint pain and swelling.  Skin: Denies redness,  rashes, lesions or ulcercations.  Neurological: Denies dizziness, difficulty with memory, difficulty with speech or problems with balance and coordination.  Psych: Pt reports irritability and anger outbursts. Denies anxiety, depression, SI/HI.  No other specific complaints in a complete review of systems (except as listed in HPI above).  Objective:   Physical Exam  BP (!) 148/78 (BP Location: Left Arm, Patient Position: Sitting, Cuff Size: Normal)   Pulse 78   Resp 16   Ht _0  (1.753 m)   Wt 195 lb 12.8 oz (88.8 kg)   SpO2 95%   BMI 28.91 kg/m   Wt Readings from Last 3 Encounters:  06/27/21 197 lb (89.4 kg)  09/27/20 188 lb (85.3 kg)  09/16/20 189 lb (85.7 kg)    General: Appears his stated age, overweight, in NAD. Skin: Warm, dry and intact. No ulcerations noted. HEENT: Head: normal shape and size; Eyes: sclera white and EOMs intact; Neck:  Neck supple, trachea midline. No masses, lumps or thyromegaly present.  Cardiovascular: Normal rate and rhythm. S1,S2 noted.  No murmur, rubs or gallops noted. No JVD or BLE edema. No carotid bruits noted. Pulmonary/Chest: Normal effort and positive vesicular breath sounds. No respiratory distress. No wheezes, rales or ronchi noted.  Abdomen: Soft and nontender. Normal bowel sounds.  Ventral hernia noted. Liver, spleen and kidneys non palpable. Musculoskeletal: Strength 5/5 BUE/BLE.  No difficulty with gait.  Neurological: Alert and oriented. Cranial nerves II-XII grossly intact. Coordination normal.  Psychiatric: Mood and affect normal. Behavior is normal. Judgment and thought content normal.    BMET    Component Value Date/Time   NA 138 09/27/2020 1536   K 3.6 09/27/2020 1536   CL 101 09/27/2020 1536   CO2 29 09/27/2020 1536   GLUCOSE 88 09/27/2020 1536   BUN 20 09/27/2020 1536   CREATININE 0.81 09/27/2020 1536   CREATININE 0.96 08/07/2019 1509   CALCIUM 9.8 09/27/2020 1536   GFRNONAA 58 (L) 07/05/2020 1402   GFRAA >60  07/05/2020 1402    Lipid Panel     Component Value Date/Time   CHOL 115 09/27/2020 1536   TRIG 89.0 09/27/2020 1536   HDL 44.30 09/27/2020 1536   CHOLHDL 3 09/27/2020 1536   VLDL 17.8 09/27/2020 1536   LDLCALC 53 09/27/2020 1536   LDLCALC 66 08/07/2019 1509    CBC    Component Value Date/Time   WBC 9.2 09/27/2020 1536   RBC 4.39 09/27/2020 1536   HGB 13.7 09/27/2020 1536   HCT 39.4 09/27/2020 1536   PLT 206.0 09/27/2020 1536   MCV 89.8 09/27/2020 1536   MCH 32.7 07/05/2020 1402   MCHC 34.7 09/27/2020 1536   RDW 12.3 09/27/2020 1536  LYMPHSABS 1.9 07/05/2020 1402   MONOABS 1.3 (H) 07/05/2020 1402   EOSABS 0.1 07/05/2020 1402   BASOSABS 0.1 07/05/2020 1402    Hgb A1C Lab Results  Component Value Date   HGBA1C 6.1 (A) 06/27/2021           Assessment & Plan:   Preventative Health Maintenance:  Flu shot UTD Tetanus UTD Encouraged him to get his COVID booster Pneumovax UTD Prevnar UTD Zostavax UTD Discussed Shingrix vaccine, if he would like to get this he would get it at the pharmacy Colon screening UTD Encouraged him to consume a balanced diet and exercise regimen Advised him to see an eye doctor and dentist annually We will check CBC, c-Met, lipid, A1c and PSA today  RTC in 6 months, follow-up chronic conditions Webb Silversmith, NP This visit occurred during the SARS-CoV-2 public health emergency.  Safety protocols were in place, including screening questions prior to the visit, additional usage of staff PPE, and extensive cleaning of exam room while observing appropriate contact time as indicated for disinfecting solutions.

## 2021-10-07 LAB — CBC
HCT: 44.5 % (ref 38.5–50.0)
Hemoglobin: 15.3 g/dL (ref 13.2–17.1)
MCH: 32.6 pg (ref 27.0–33.0)
MCHC: 34.4 g/dL (ref 32.0–36.0)
MCV: 94.7 fL (ref 80.0–100.0)
MPV: 10.7 fL (ref 7.5–12.5)
Platelets: 174 10*3/uL (ref 140–400)
RBC: 4.7 10*6/uL (ref 4.20–5.80)
RDW: 12.2 % (ref 11.0–15.0)
WBC: 7.2 10*3/uL (ref 3.8–10.8)

## 2021-10-07 LAB — LIPID PANEL
Cholesterol: 184 mg/dL (ref <200)
HDL: 69 mg/dL (ref >=40)
LDL Cholesterol (Calc): 89 mg/dL (calc)
Non-HDL Cholesterol (Calc): 115 mg/dL (calc) (ref <130)
Total CHOL/HDL Ratio: 2.7 (calc) (ref <5.0)
Triglycerides: 166 mg/dL — ABNORMAL HIGH (ref <150)

## 2021-10-07 LAB — COMPLETE METABOLIC PANEL WITH GFR
AG Ratio: 1.5 (calc) (ref 1.0–2.5)
ALT: 66 U/L — ABNORMAL HIGH (ref 9–46)
AST: 58 U/L — ABNORMAL HIGH (ref 10–35)
Albumin: 4.3 g/dL (ref 3.6–5.1)
Alkaline phosphatase (APISO): 59 U/L (ref 35–144)
BUN: 17 mg/dL (ref 7–25)
CO2: 28 mmol/L (ref 20–32)
Calcium: 9.5 mg/dL (ref 8.6–10.3)
Chloride: 101 mmol/L (ref 98–110)
Creat: 0.83 mg/dL (ref 0.70–1.35)
Globulin: 2.8 g/dL (calc) (ref 1.9–3.7)
Glucose, Bld: 114 mg/dL — ABNORMAL HIGH (ref 65–99)
Potassium: 4 mmol/L (ref 3.5–5.3)
Sodium: 140 mmol/L (ref 135–146)
Total Bilirubin: 0.9 mg/dL (ref 0.2–1.2)
Total Protein: 7.1 g/dL (ref 6.1–8.1)
eGFR: 96 mL/min/{1.73_m2} (ref >=60)

## 2021-10-07 LAB — HEMOGLOBIN A1C
Hgb A1c MFr Bld: 6.5 % of total Hgb — ABNORMAL HIGH (ref <5.7)
Mean Plasma Glucose: 140 mg/dL
eAG (mmol/L): 7.7 mmol/L

## 2021-10-07 LAB — PSA: PSA: 1.23 ng/mL (ref <=4.00)

## 2021-10-20 ENCOUNTER — Other Ambulatory Visit: Payer: Self-pay | Admitting: Internal Medicine

## 2021-10-21 NOTE — Telephone Encounter (Signed)
Requested Prescriptions  Pending Prescriptions Disp Refills   losartan-hydrochlorothiazide (HYZAAR) 100-12.5 MG tablet [Pharmacy Med Name: LOSARTAN POTASSIUM-HCTZ 100-12.5 MG] 90 tablet 0    Sig: TAKE 1 TABLET BY MOUTH ONCE A DAY. PER MD MUST SCHEDULE PHYSICAL EXAM.     Cardiovascular: ARB + Diuretic Combos Failed - 10/20/2021 10:41 AM      Failed - Last BP in normal range    BP Readings from Last 1 Encounters:  10/06/21 (!) 148/78         Passed - K in normal range and within 180 days    Potassium  Date Value Ref Range Status  10/06/2021 4.0 3.5 - 5.3 mmol/L Final         Passed - Na in normal range and within 180 days    Sodium  Date Value Ref Range Status  10/06/2021 140 135 - 146 mmol/L Final         Passed - Cr in normal range and within 180 days    Creat  Date Value Ref Range Status  10/06/2021 0.83 0.70 - 1.35 mg/dL Final         Passed - Ca in normal range and within 180 days    Calcium  Date Value Ref Range Status  10/06/2021 9.5 8.6 - 10.3 mg/dL Final         Passed - Patient is not pregnant      Passed - Valid encounter within last 6 months    Recent Outpatient Visits          2 weeks ago Encounter for general adult medical examination with abnormal findings   [facility] [place], Coralie Keens, NP   3 months ago Overweight with body mass index (BMI) of 29 to 29.9 in adult   [facility] [place], Coralie Keens, [place]

## 2021-11-17 ENCOUNTER — Other Ambulatory Visit: Payer: Self-pay | Admitting: Internal Medicine

## 2021-11-17 NOTE — Telephone Encounter (Signed)
Requested Prescriptions  Pending Prescriptions Disp Refills   losartan-hydrochlorothiazide (HYZAAR) 100-12.5 MG tablet [Pharmacy Med Name: LOSARTAN POTASSIUM-HCTZ 100-12.5 MG] 90 tablet 0    Sig: TAKE 1 TABLET BY MOUTH ONCE A DAY. PER MD MUST SCHEDULE PHYSICAL EXAM.     Cardiovascular: ARB + Diuretic Combos Failed - 11/17/2021 10:53 AM      Failed - Last BP in normal range    BP Readings from Last 1 Encounters:  10/06/21 (!) 148/78         Passed - K in normal range and within 180 days    Potassium  Date Value Ref Range Status  10/06/2021 4.0 3.5 - 5.3 mmol/L Final         Passed - Na in normal range and within 180 days    Sodium  Date Value Ref Range Status  10/06/2021 140 135 - 146 mmol/L Final         Passed - Cr in normal range and within 180 days    Creat  Date Value Ref Range Status  10/06/2021 0.83 0.70 - 1.35 mg/dL Final         Passed - Ca in normal range and within 180 days    Calcium  Date Value Ref Range Status  10/06/2021 9.5 8.6 - 10.3 mg/dL Final         Passed - Patient is not pregnant      Passed - Valid encounter within last 6 months    Recent Outpatient Visits          1 month ago Encounter for general adult medical examination with abnormal findings   [facility] [place], Coralie Keens, NP   4 months ago Overweight with body mass index (BMI) of 29 to 29.9 in adult   [facility] [place], Coralie Keens, [place]

## 2021-12-19 ENCOUNTER — Other Ambulatory Visit: Payer: Self-pay | Admitting: Internal Medicine

## 2021-12-19 NOTE — Telephone Encounter (Signed)
Requested Prescriptions  Pending Prescriptions Disp Refills   simvastatin (ZOCOR) 20 MG tablet [Pharmacy Med Name: SIMVASTATIN 20 MG TAB] 90 tablet 3    Sig: TAKE 1 TABLET BY MOUTH ONCE A DAY AT 6 PM     Cardiovascular:  Antilipid - Statins Failed - 12/19/2021 10:02 AM      Failed - Lipid Panel in normal range within the last 12 months    Cholesterol  Date Value Ref Range Status  10/06/2021 184 <200 mg/dL Final   LDL Cholesterol (Calc)  Date Value Ref Range Status  10/06/2021 89 mg/dL (calc) Final    Comment:    Reference range: <100 . Desirable range <100 mg/dL for primary prevention;   <70 mg/dL for patients with CHD or diabetic patients  with > or = 2 CHD risk factors. Marland Kitchen LDL-C is now calculated using the Martin-Hopkins  calculation, which is a validated novel method providing  better accuracy than the Friedewald equation in the  estimation of LDL-C.  Cresenciano Genre et al. Annamaria Helling. 2751;700(17): 2061-2068  (http://education.[employer].com/faq/FAQ164)    Direct LDL  Date Value Ref Range Status  09/07/2016 74.0 mg/dL Final    Comment:    Optimal:  <100 mg/dLNear or Above Optimal:  100-129 mg/dLBorderline High:  130-159 mg/dLHigh:  160-189 mg/dLVery High:  >190 mg/dL   HDL  Date Value Ref Range Status  10/06/2021 69 > OR = 40 mg/dL Final   Triglycerides  Date Value Ref Range Status  10/06/2021 166 (H) <150 mg/dL Final         Passed - Patient is not pregnant      Passed - Valid encounter within last 12 months    Recent Outpatient Visits          2 months ago Encounter for general adult medical examination with abnormal findings   [facility] [place], Coralie Keens, NP   5 months ago Overweight with body mass index (BMI) of 29 to 29.9 in adult   [facility] [place], Coralie Keens, [place]

## 2022-02-16 ENCOUNTER — Other Ambulatory Visit: Payer: Self-pay | Admitting: Internal Medicine

## 2022-02-16 NOTE — Telephone Encounter (Signed)
Requested Prescriptions  ?Pending Prescriptions Disp Refills  ?? losartan-hydrochlorothiazide (HYZAAR) 100-12.5 MG tablet [Pharmacy Med Name: LOSARTAN POTASSIUM-HCTZ 100-12.5 MG] 90 tablet 0  ?  Sig: TAKE 1 TABLET BY MOUTH ONCE A DAY. PER MD MUST SCHEDULE PHYSICAL EXAM.  ?  ? Cardiovascular: ARB + Diuretic Combos Failed - 02/16/2022  9:20 AM  ?  ?  Failed - Last BP in normal range  ?  BP Readings from Last 1 Encounters:  ?10/06/21 (!) 148/78  ?   ?  ?  Passed - K in normal range and within 180 days  ?  Potassium  ?Date Value Ref Range Status  ?10/06/2021 4.0 3.5 - 5.3 mmol/L Final  ?   ?  ?  Passed - Na in normal range and within 180 days  ?  Sodium  ?Date Value Ref Range Status  ?10/06/2021 140 135 - 146 mmol/L Final  ?   ?  ?  Passed - Cr in normal range and within 180 days  ?  Creat  ?Date Value Ref Range Status  ?10/06/2021 0.83 0.70 - 1.35 mg/dL Final  ?   ?  ?  Passed - eGFR is 10 or above and within 180 days  ?  GFR calc Af Amer  ?Date Value Ref Range Status  ?07/05/2020 >60 >60 mL/min Final  ? ?GFR calc non Af Amer  ?Date Value Ref Range Status  ?07/05/2020 58 (L) >60 mL/min Final  ? ?GFR  ?Date Value Ref Range Status  ?09/27/2020 92.00 >60.00 mL/min Final  ?  Comment:  ?  Calculated using the CKD-EPI Creatinine Equation (2021)  ? ?eGFR  ?Date Value Ref Range Status  ?10/06/2021 96 > OR = 60 mL/min/1.33m Final  ?  Comment:  ?  The eGFR is based on the CKD-EPI 2021 equation. To calculate  ?the new eGFR from a previous Creatinine or Cystatin C ?result, go to https://www.kidney.org/professionals/ ?kdoqi/gfr%5Fcalculator ?  ?   ?  ?  Passed - Patient is not pregnant  ?  ?  Passed - Valid encounter within last 6 months  ?  Recent Outpatient Visits   ?      ? 4 months ago Encounter for general adult medical examination with abnormal findings  ? [facility] [place]  ? 7 months ago Overweight with body mass index (BMI) of 29 to 29.9 in adult  ? [facility]  ?  ?  ?Future Appointments   ?        ? In 1 month [place] [facility] [place] ?  ? ?  ?  ?  ? ?

## 2022-04-06 ENCOUNTER — Encounter: Payer: Self-pay | Admitting: Internal Medicine

## 2022-04-06 ENCOUNTER — Ambulatory Visit: Payer: 59 | Admitting: Internal Medicine

## 2022-04-06 VITALS — BP 132/64 | HR 73 | Temp 97.1°F | Wt 194.0 lb

## 2022-04-06 DIAGNOSIS — I10 Essential (primary) hypertension: Secondary | ICD-10-CM

## 2022-04-06 DIAGNOSIS — M65331 Trigger finger, right middle finger: Secondary | ICD-10-CM | POA: Diagnosis not present

## 2022-04-06 DIAGNOSIS — J4530 Mild persistent asthma, uncomplicated: Secondary | ICD-10-CM

## 2022-04-06 DIAGNOSIS — K760 Fatty (change of) liver, not elsewhere classified: Secondary | ICD-10-CM

## 2022-04-06 DIAGNOSIS — Z6828 Body mass index (BMI) 28.0-28.9, adult: Secondary | ICD-10-CM

## 2022-04-06 DIAGNOSIS — E663 Overweight: Secondary | ICD-10-CM

## 2022-04-06 DIAGNOSIS — E119 Type 2 diabetes mellitus without complications: Secondary | ICD-10-CM | POA: Diagnosis not present

## 2022-04-06 DIAGNOSIS — E7800 Pure hypercholesterolemia, unspecified: Secondary | ICD-10-CM

## 2022-04-06 DIAGNOSIS — K219 Gastro-esophageal reflux disease without esophagitis: Secondary | ICD-10-CM

## 2022-04-06 LAB — POCT GLYCOSYLATED HEMOGLOBIN (HGB A1C): Hemoglobin A1C: 6.4 % — AB (ref 4.0–5.6)

## 2022-04-06 LAB — HM DIABETES EYE EXAM

## 2022-04-06 NOTE — Assessment & Plan Note (Signed)
POCT A1c 6.4% We will check urine microalbumin today Encouraged him to consume a low-carb diet and exercise weight loss No meds Encouraged him to make sure he is getting diabetic eye exams Encourage routine foot exams Flu, Pneumovax and Prevnar UTD Encouraged him to get his COVID booster

## 2022-04-06 NOTE — Assessment & Plan Note (Signed)
Controlled on Losartan HCT Reinforced DASH diet and exercise weight loss C-Met today

## 2022-04-06 NOTE — Patient Instructions (Signed)
Heart-Healthy Eating Plan Heart-healthy meal planning includes: Eating less unhealthy fats. Eating more healthy fats. Making other changes in your diet. Talk with your doctor or a diet specialist (dietitian) to create an eating plan that is right for you. What is my plan? Your doctor may recommend an eating plan that includes: Total fat: ______% or less of total calories a day. Saturated fat: ______% or less of total calories a day. Cholesterol: less than _________mg a day. What are tips for following this plan? Cooking Avoid frying your food. Try to bake, boil, grill, or broil it instead. You can also reduce fat by: Removing the skin from poultry. Removing all visible fats from meats. Steaming vegetables in water or broth. Meal planning  At meals, divide your plate into four equal parts: Fill one-half of your plate with vegetables and green salads. Fill one-fourth of your plate with whole grains. Fill one-fourth of your plate with lean protein foods. Eat 4-5 servings of vegetables per day. A serving of vegetables is: 1 cup of raw or cooked vegetables. 2 cups of raw leafy greens. Eat 4-5 servings of fruit per day. A serving of fruit is: 1 medium whole fruit.  cup of dried fruit.  cup of fresh, frozen, or canned fruit.  cup of 100% fruit juice. Eat more foods that have soluble fiber. These are apples, broccoli, carrots, beans, peas, and barley. Try to get 20-30 g of fiber per day. Eat 4-5 servings of nuts, legumes, and seeds per week: 1 serving of dried beans or legumes equals  cup after being cooked. 1 serving of nuts is  cup. 1 serving of seeds equals 1 tablespoon. General information Eat more home-cooked food. Eat less restaurant, buffet, and fast food. Limit or avoid alcohol. Limit foods that are high in starch and sugar. Avoid fried foods. Lose weight if you are overweight. Keep track of how much salt (sodium) you eat. This is important if you have high blood  pressure. Ask your doctor to tell you more about this. Try to add vegetarian meals each week. Fats Choose healthy fats. These include olive oil and canola oil, flaxseeds, walnuts, almonds, and seeds. Eat more omega-3 fats. These include salmon, mackerel, sardines, tuna, flaxseed oil, and ground flaxseeds. Try to eat fish at least 2 times each week. Check food labels. Avoid foods with trans fats or high amounts of saturated fat. Limit saturated fats. These are often found in animal products, such as meats, butter, and cream. These are also found in plant foods, such as palm oil, palm kernel oil, and coconut oil. Avoid foods with partially hydrogenated oils in them. These have trans fats. Examples are stick margarine, some tub margarines, cookies, crackers, and other baked goods. What foods can I eat? Fruits All fresh, canned (in natural juice), or frozen fruits. Vegetables Fresh or frozen vegetables (raw, steamed, roasted, or grilled). Green salads. Grains Most grains. Choose whole wheat and whole grains most of the time. Rice and pasta, including brown rice and pastas made with whole wheat. Meats and other proteins Lean, well-trimmed beef, veal, pork, and lamb. Chicken and turkey without skin. All fish and shellfish. Wild duck, rabbit, pheasant, and venison. Egg whites or low-cholesterol egg substitutes. Dried beans, peas, lentils, and tofu. Seeds and most nuts. Dairy Low-fat or nonfat cheeses, including ricotta and mozzarella. Skim or 1% milk that is liquid, powdered, or evaporated. Buttermilk that is made with low-fat milk. Nonfat or low-fat yogurt. Fats and oils Non-hydrogenated (trans-free) margarines. Vegetable oils, including   soybean, sesame, sunflower, olive, peanut, safflower, corn, canola, and cottonseed. Salad dressings or mayonnaise made with a vegetable oil. Beverages Mineral water. Coffee and tea. Diet carbonated beverages. Sweets and desserts Sherbet, gelatin, and fruit ice.  Small amounts of dark chocolate. Limit all sweets and desserts. Seasonings and condiments All seasonings and condiments. The items listed above may not be a complete list of foods and drinks you can eat. Contact a dietitian for more options. What foods should I avoid? Fruits Canned fruit in heavy syrup. Fruit in cream or butter sauce. Fried fruit. Limit coconut. Vegetables Vegetables cooked in cheese, cream, or butter sauce. Fried vegetables. Grains Breads that are made with saturated or trans fats, oils, or whole milk. Croissants. Sweet rolls. Donuts. High-fat crackers, such as cheese crackers. Meats and other proteins Fatty meats, such as hot dogs, ribs, sausage, bacon, rib-eye roast or steak. High-fat deli meats, such as salami and bologna. Caviar. Domestic duck and goose. Organ meats, such as liver. Dairy Cream, sour cream, cream cheese, and creamed cottage cheese. Whole-milk cheeses. Whole or 2% milk that is liquid, evaporated, or condensed. Whole buttermilk. Cream sauce or high-fat cheese sauce. Yogurt that is made from whole milk. Fats and oils Meat fat, or shortening. Cocoa butter, hydrogenated oils, palm oil, coconut oil, palm kernel oil. Solid fats and shortenings, including bacon fat, salt pork, lard, and butter. Nondairy cream substitutes. Salad dressings with cheese or sour cream. Beverages Regular sodas and juice drinks with added sugar. Sweets and desserts Frosting. Pudding. Cookies. Cakes. Pies. Milk chocolate or white chocolate. Buttered syrups. Full-fat ice cream or ice cream drinks. The items listed above may not be a complete list of foods and drinks to avoid. Contact a dietitian for more information. Summary Heart-healthy meal planning includes eating less unhealthy fats, eating more healthy fats, and making other changes in your diet. Eat a balanced diet. This includes fruits and vegetables, low-fat or nonfat dairy, lean protein, nuts and legumes, whole grains, and  heart-healthy oils and fats. This information is not intended to replace advice given to you by your health care provider. Make sure you discuss any questions you have with your health care provider. Document Revised: 03/02/2021 Document Reviewed: 03/02/2021 Elsevier Patient Education  2022 Elsevier Inc.  

## 2022-04-06 NOTE — Assessment & Plan Note (Signed)
Encourage diet and exercise for weight loss 

## 2022-04-06 NOTE — Assessment & Plan Note (Signed)
Avoid eating late at night Encourage weight loss as this can help reduce reflux symptoms Continue Omeprazole as needed

## 2022-04-06 NOTE — Progress Notes (Addendum)
Subjective:    Patient ID: Nathan Conway, male    DOB: 08/08/1956, 66 y.o.   MRN: [id number]  HPI  Patient presents to clinic today for 28-monthfollow-up of chronic conditions.  DM2: His last A1c was 6.5%, 10/2021.  He is not taking any oral diabetic medication at this time.  He does not check his sugars.  He checks his feet routinely.  His last eye exam was 04/2022, [place].  Flu 09/2021.  Pneumovax 04/2018.  Prevnar 09/2020.  COVID [employer]  HTN: His BP today is 132/64.  He is taking Losartan HCT as prescribed.  ECG from 07/2020 reviewed.  HLD: His last LDL was 89, triglycerides 166, 10/2021.  He denies myalgias on Simvastatin.  He does not consume a low-fat diet.  Asthma: Mild, persistent. He denies chronic cough or SOB.  Managed on Breo and Albuterol.  There are no PFTs on file.  He does not follow with pulmonology.  GERD: Triggered by eating late at night.  He he takes Omeprazole only as needed with no relief of symptoms.  There is no upper GI on file.  Fatty Liver: His last AST/ALT was 58/66, 10/2021.  Patient reports trigger finger of his right third finger.  He reports this has been an ongoing issue but is starting to become contractured.  He would like referral to a hand specialist.  Review of Systems     Past Medical History:  Diagnosis Date   Adenomatous colon polyp    Allergy    Asthma    Blood transfusion without reported diagnosis    20-30 yrs ago but not 100% sure    Chicken pox    Diverticulosis    Hyperlipidemia    Hypertension    Personal history of kidney stones    passed stones - no surgery    Current Outpatient Medications  Medication Sig Dispense Refill   albuterol (PROVENTIL HFA) 108 (90 Base) MCG/ACT inhaler Inhale 1-2 puffs into the lungs every 6 (six) hours as needed for wheezing or shortness of breath. 18 g 2   aspirin 81 MG tablet Take 81 mg by mouth daily.     cholecalciferol (VITAMIN D) 1000 units tablet Take 1,000 Units by mouth daily.      fluticasone furoate-vilanterol (BREO ELLIPTA) 200-25 MCG/INH AEPB Inhale 2 puffs into the lungs daily.      losartan-hydrochlorothiazide (HYZAAR) 100-12.5 MG tablet TAKE 1 TABLET BY MOUTH ONCE A DAY. PER MD MUST SCHEDULE PHYSICAL EXAM. 90 tablet 0   omeprazole (PRILOSEC) 20 MG capsule Take 1 capsule (20 mg total) by mouth daily as needed. 90 capsule 1   simvastatin (ZOCOR) 20 MG tablet TAKE 1 TABLET BY MOUTH ONCE A DAY AT 6 PM 90 tablet 3   No current facility-administered medications for this visit.    No Known Allergies  Family History  Problem Relation Age of Onset   Hypertension Mother    Alcohol abuse Father    Lung cancer Father    Diabetes Neg Hx    Heart disease Neg Hx    Stroke Neg Hx    Colon cancer Neg Hx    Esophageal cancer Neg Hx    Rectal cancer Neg Hx    Stomach cancer Neg Hx    Colon polyps Neg Hx     Social History   Socioeconomic History   Marital status: Married    Spouse name: Not on file   Number of children: Not on file   Years of  education: Not on file   Highest education level: Not on file  Occupational History   Not on file  Tobacco Use   Smoking status: Former    Packs/day: 2.00    Types: Cigarettes    Quit date: 11/05/1977    Years since quitting: 44.4   Smokeless tobacco: Never  Substance and Sexual Activity   Alcohol use: Not Currently    Alcohol/week: 2.0 standard drinks    Types: 2 Shots of liquor per week   Drug use: No   Sexual activity: Not Currently  Other Topics Concern   Not on file  Social History Narrative   Not on file   Social Determinants of Health   Financial Resource Strain: Not on file  Food Insecurity: Not on file  Transportation Needs: Not on file  Physical Activity: Not on file  Stress: Not on file  Social Connections: Not on file  Intimate Partner Violence: Not on file     Constitutional: Denies fever, malaise, fatigue, headache or abrupt weight changes.  HEENT: Denies eye pain, eye redness, ear pain,  ringing in the ears, wax buildup, runny nose, nasal congestion, bloody nose, or sore throat. Respiratory: Denies difficulty breathing, shortness of breath, cough or sputum production.   Cardiovascular: Denies chest pain, chest tightness, palpitations or swelling in the hands or feet.  Gastrointestinal: Denies abdominal pain, bloating, constipation, diarrhea or blood in the stool.  GU: Denies urgency, frequency, pain with urination, burning sensation, blood in urine, odor or discharge. Musculoskeletal: Patient reports trigger finger of right third finger.  Denies difficulty with gait, muscle pain or joint  swelling.  Skin: Denies redness, rashes, lesions or ulcercations.  Neurological: Patient reports intermittent numbness and tingling in his feet.  Denies dizziness, difficulty with memory, difficulty with speech or problems with balance and coordination.  Psych: Denies anxiety, depression, SI/HI.  No other specific complaints in a complete review of systems (except as listed in HPI above).  Objective:   Physical Exam   BP 132/64 (BP Location: Right Arm, Patient Position: Sitting, Cuff Size: Normal)   Pulse 73   Temp (!) 97.1 F (36.2 C) (Temporal)   Wt 194 lb (88 kg)   SpO2 98%   BMI 28.65 kg/m   Wt Readings from Last 3 Encounters:  10/06/21 195 lb 12.8 oz (88.8 kg)  06/27/21 197 lb (89.4 kg)  09/27/20 188 lb (85.3 kg)    General: Appears his stated age, overweight, in NAD. Skin: Warm, dry and intact. No ulcerations noted. HEENT: Head: normal shape and size; Eyes: sclera white, no icterus, conjunctiva pink, PERRLA and EOMs intact;  Cardiovascular: Normal rate and rhythm. S1,S2 noted.  No murmur, rubs or gallops noted. No JVD or BLE edema. No carotid bruits noted. Pulmonary/Chest: Normal effort and positive vesicular breath sounds. No respiratory distress. No wheezes, rales or ronchi noted.  Abdomen: Soft and nontender. Normal bowel sounds.  Musculoskeletal: Contracture noted of  right middle finger.  Nodule noted in palm of hand.  No difficulty with gait.  Neurological: Alert and oriented.    BMET    Component Value Date/Time   NA 140 10/06/2021 1414   K 4.0 10/06/2021 1414   CL 101 10/06/2021 1414   CO2 28 10/06/2021 1414   GLUCOSE 114 (H) 10/06/2021 1414   BUN 17 10/06/2021 1414   CREATININE 0.83 10/06/2021 1414   CALCIUM 9.5 10/06/2021 1414   GFRNONAA 58 (L) 07/05/2020 1402   GFRAA >60 07/05/2020 1402    Lipid Panel  Component Value Date/Time   CHOL 184 10/06/2021 1414   TRIG 166 (H) 10/06/2021 1414   HDL 69 10/06/2021 1414   CHOLHDL 2.7 10/06/2021 1414   VLDL 17.8 09/27/2020 1536   LDLCALC 89 10/06/2021 1414    CBC    Component Value Date/Time   WBC 7.2 10/06/2021 1414   RBC 4.70 10/06/2021 1414   HGB 15.3 10/06/2021 1414   HCT 44.5 10/06/2021 1414   PLT 174 10/06/2021 1414   MCV 94.7 10/06/2021 1414   MCH 32.6 10/06/2021 1414   MCHC 34.4 10/06/2021 1414   RDW 12.2 10/06/2021 1414   LYMPHSABS 1.9 07/05/2020 1402   MONOABS 1.3 (H) 07/05/2020 1402   EOSABS 0.1 07/05/2020 1402   BASOSABS 0.1 07/05/2020 1402    Hgb A1C Lab Results  Component Value Date   HGBA1C 6.5 (H) 10/06/2021          Assessment & Plan:   Trigger Finger, Right Middle:  Referral to [facility] Peggye Ley at [facility] for further evaluation and treatment  RTC in 6 months for your welcome to Medicare exam  Webb Silversmith, NP

## 2022-04-06 NOTE — Assessment & Plan Note (Signed)
Encourage low-fat diet and exercise for weight loss C-Met today

## 2022-04-06 NOTE — Assessment & Plan Note (Signed)
C-Met and lipid profile today ?Encouraged him to consume a low-fat diet ?Continue Simvastatin ?

## 2022-04-06 NOTE — Assessment & Plan Note (Signed)
Continue Breo and Albuterol We will monitor

## 2022-04-07 LAB — COMPLETE METABOLIC PANEL WITH GFR
AG Ratio: 1.6 (calc) (ref 1.0–2.5)
ALT: 17 U/L (ref 9–46)
AST: 18 U/L (ref 10–35)
Albumin: 4.2 g/dL (ref 3.6–5.1)
Alkaline phosphatase (APISO): 69 U/L (ref 35–144)
BUN: 19 mg/dL (ref 7–25)
CO2: 24 mmol/L (ref 20–32)
Calcium: 9.1 mg/dL (ref 8.6–10.3)
Chloride: 105 mmol/L (ref 98–110)
Creat: 0.94 mg/dL (ref 0.70–1.35)
Globulin: 2.7 g/dL (calc) (ref 1.9–3.7)
Glucose, Bld: 124 mg/dL (ref 65–139)
Potassium: 3.7 mmol/L (ref 3.5–5.3)
Sodium: 139 mmol/L (ref 135–146)
Total Bilirubin: 0.5 mg/dL (ref 0.2–1.2)
Total Protein: 6.9 g/dL (ref 6.1–8.1)
eGFR: 89 mL/min/{1.73_m2} (ref >=60)

## 2022-04-07 LAB — LIPID PANEL
Cholesterol: 122 mg/dL (ref <200)
HDL: 44 mg/dL (ref >=40)
LDL Cholesterol (Calc): 47 mg/dL (calc)
Non-HDL Cholesterol (Calc): 78 mg/dL (calc) (ref <130)
Total CHOL/HDL Ratio: 2.8 (calc) (ref <5.0)
Triglycerides: 295 mg/dL — ABNORMAL HIGH (ref <150)

## 2022-04-07 LAB — MICROALBUMIN / CREATININE URINE RATIO
Creatinine, Urine: 100 mg/dL (ref 20–320)
Microalb Creat Ratio: 2 mcg/mg creat (ref <30)
Microalb, Ur: 0.2 mg/dL

## 2022-04-09 ENCOUNTER — Other Ambulatory Visit: Payer: Self-pay

## 2022-04-09 NOTE — Telephone Encounter (Signed)
Pt advised.  He agreed to start Zetia.  Please send to [facility].   Thanks,   -Mickel Baas

## 2022-04-10 MED ORDER — EZETIMIBE 10 MG PO TABS: 10.0000 mg | ORAL_TABLET | Freq: Every day | ORAL | 1 refills | Status: DC

## 2022-05-01 DIAGNOSIS — E119 Type 2 diabetes mellitus without complications: Secondary | ICD-10-CM | POA: Diagnosis not present

## 2022-05-01 DIAGNOSIS — M720 Palmar fascial fibromatosis [Dupuytren]: Secondary | ICD-10-CM | POA: Diagnosis not present

## 2022-05-16 ENCOUNTER — Other Ambulatory Visit: Payer: Self-pay | Admitting: Internal Medicine

## 2022-05-16 NOTE — Telephone Encounter (Signed)
Requested Prescriptions  Pending Prescriptions Disp Refills  . losartan-hydrochlorothiazide (HYZAAR) 100-12.5 MG tablet [Pharmacy Med Name: LOSARTAN POTASSIUM-HCTZ 100-12.5 MG] 90 tablet 0    Sig: TAKE 1 TABLET BY MOUTH ONCE A DAY. PER MD MUST SCHEDULE PHYSICAL EXAM.     Cardiovascular: ARB + Diuretic Combos Passed - 05/16/2022 10:59 AM      Passed - K in normal range and within 180 days    Potassium  Date Value Ref Range Status  04/06/2022 3.7 3.5 - 5.3 mmol/L Final         Passed - Na in normal range and within 180 days    Sodium  Date Value Ref Range Status  04/06/2022 139 135 - 146 mmol/L Final         Passed - Cr in normal range and within 180 days    Creat  Date Value Ref Range Status  04/06/2022 0.94 0.70 - 1.35 mg/dL Final   Creatinine, Urine  Date Value Ref Range Status  04/06/2022 100 20 - 320 mg/dL Final         Passed - eGFR is 10 or above and within 180 days    GFR calc Af Amer  Date Value Ref Range Status  07/05/2020 >60 >60 mL/min Final   GFR calc non Af Amer  Date Value Ref Range Status  07/05/2020 58 (L) >60 mL/min Final   GFR  Date Value Ref Range Status  09/27/2020 92.00 >60.00 mL/min Final    Comment:    Calculated using the CKD-EPI Creatinine Equation (2021)   eGFR  Date Value Ref Range Status  04/06/2022 89 > OR = 60 mL/min/1.49m2 Final    Comment:    The eGFR is based on the CKD-EPI 2021 equation. To calculate  the new eGFR from a previous Creatinine or Cystatin C result, go to https://www.kidney.org/professionals/ kdoqi/gfr%5Fcalculator          Passed - Patient is not pregnant      Passed - Last BP in normal range    BP Readings from Last 1 Encounters:  04/06/22 132/64         Passed - Valid encounter within last 6 months    Recent Outpatient Visits          1 month ago Fatty liver disease, nonalcoholic   [place], Coralie Keens, NP   7 months ago Encounter for general adult medical examination with  abnormal findings   [facility] [place], Coralie Keens, NP   10 months ago Overweight with body mass index (BMI) of 29 to 29.9 in adult   [facility] [place], Coralie Keens, [place]

## 2022-05-17 DIAGNOSIS — M720 Palmar fascial fibromatosis [Dupuytren]: Secondary | ICD-10-CM | POA: Diagnosis not present

## 2022-05-29 DIAGNOSIS — M720 Palmar fascial fibromatosis [Dupuytren]: Secondary | ICD-10-CM | POA: Diagnosis not present

## 2022-06-14 ENCOUNTER — Other Ambulatory Visit: Payer: Self-pay | Admitting: Internal Medicine

## 2022-06-14 NOTE — Telephone Encounter (Signed)
Requested medication (s) are due for refill today: yes  Requested medication (s) are on the active medication list: yes  Last refill:  05/16/22 #90 0 refills  Future visit scheduled: no   Notes to clinic:  do you want to continue refills ?     Requested Prescriptions  Pending Prescriptions Disp Refills   losartan-hydrochlorothiazide (HYZAAR) 100-12.5 MG tablet [Pharmacy Med Name: LOSARTAN POTASSIUM-HCTZ 100-12.5 MG] 90 tablet 0    Sig: TAKE 1 TABLET BY MOUTH ONCE A DAY. PER MD MUST SCHEDULE PHYSICAL EXAM.     Cardiovascular: ARB + Diuretic Combos Passed - 06/14/2022 10:55 AM      Passed - K in normal range and within 180 days    Potassium  Date Value Ref Range Status  04/06/2022 3.7 3.5 - 5.3 mmol/L Final         Passed - Na in normal range and within 180 days    Sodium  Date Value Ref Range Status  04/06/2022 139 135 - 146 mmol/L Final         Passed - Cr in normal range and within 180 days    Creat  Date Value Ref Range Status  04/06/2022 0.94 0.70 - 1.35 mg/dL Final   Creatinine, Urine  Date Value Ref Range Status  04/06/2022 100 20 - 320 mg/dL Final         Passed - eGFR is 10 or above and within 180 days    GFR calc Af Amer  Date Value Ref Range Status  07/05/2020 >60 >60 mL/min Final   GFR calc non Af Amer  Date Value Ref Range Status  07/05/2020 58 (L) >60 mL/min Final   GFR  Date Value Ref Range Status  09/27/2020 92.00 >60.00 mL/min Final    Comment:    Calculated using the CKD-EPI Creatinine Equation (2021)   eGFR  Date Value Ref Range Status  04/06/2022 89 > OR = 60 mL/min/1.4m Final    Comment:    The eGFR is based on the CKD-EPI 2021 equation. To calculate  the new eGFR from a previous Creatinine or Cystatin C result, go to https://www.kidney.org/professionals/ kdoqi/gfr%5Fcalculator          Passed - Patient is not pregnant      Passed - Last BP in normal range    BP Readings from Last 1 Encounters:  04/06/22 132/64          Passed - Valid encounter within last 6 months    Recent Outpatient Visits           2 months ago Fatty liver disease, nonalcoholic   [place]   8 months ago Encounter for general adult medical examination with abnormal findings   [facility]   11 months ago Overweight with body mass index (BMI) of 29 to 29.9 in adult   [facility] [place]

## 2022-06-27 ENCOUNTER — Other Ambulatory Visit: Payer: Self-pay | Admitting: Internal Medicine

## 2022-06-28 NOTE — Telephone Encounter (Signed)
Requested by interface surescripts. Last refill 05/16/22 #90 requesting too soon. Requested Prescriptions  Refused Prescriptions Disp Refills  . losartan-hydrochlorothiazide (HYZAAR) 100-12.5 MG tablet [Pharmacy Med Name: LOSARTAN POTASSIUM-HCTZ 100-12.5 MG] 90 tablet 0    Sig: TAKE 1 TABLET BY MOUTH ONCE A DAY. PER MD MUST SCHEDULE PHYSICAL EXAM.     Cardiovascular: ARB + Diuretic Combos Passed - 06/27/2022  3:27 PM      Passed - K in normal range and within 180 days    Potassium  Date Value Ref Range Status  04/06/2022 3.7 3.5 - 5.3 mmol/L Final         Passed - Na in normal range and within 180 days    Sodium  Date Value Ref Range Status  04/06/2022 139 135 - 146 mmol/L Final         Passed - Cr in normal range and within 180 days    Creat  Date Value Ref Range Status  04/06/2022 0.94 0.70 - 1.35 mg/dL Final   Creatinine, Urine  Date Value Ref Range Status  04/06/2022 100 20 - 320 mg/dL Final         Passed - eGFR is 10 or above and within 180 days    GFR calc Af Amer  Date Value Ref Range Status  07/05/2020 >60 >60 mL/min Final   GFR calc non Af Amer  Date Value Ref Range Status  07/05/2020 58 (L) >60 mL/min Final   GFR  Date Value Ref Range Status  09/27/2020 92.00 >60.00 mL/min Final    Comment:    Calculated using the CKD-EPI Creatinine Equation (2021)   eGFR  Date Value Ref Range Status  04/06/2022 89 > OR = 60 mL/min/1.72m2 Final    Comment:    The eGFR is based on the CKD-EPI 2021 equation. To calculate  the new eGFR from a previous Creatinine or Cystatin C result, go to https://www.kidney.org/professionals/ kdoqi/gfr%5Fcalculator          Passed - Patient is not pregnant      Passed - Last BP in normal range    BP Readings from Last 1 Encounters:  04/06/22 132/64         Passed - Valid encounter within last 6 months    Recent Outpatient Visits          2 months ago Fatty liver disease, nonalcoholic   [place], Coralie Keens, NP   8 months ago Encounter for general adult medical examination with abnormal findings   [facility] [place], Coralie Keens, NP   1 year ago Overweight with body mass index (BMI) of 29 to 29.9 in adult   [facility] [place], Coralie Keens, [place]

## 2022-06-28 NOTE — Telephone Encounter (Signed)
Called pharmacy to confirm 05/16/22 was last refill for medication and pharmacy staff confirmed.

## 2022-07-25 ENCOUNTER — Other Ambulatory Visit: Payer: Self-pay | Admitting: Internal Medicine

## 2022-07-26 NOTE — Telephone Encounter (Signed)
Requested Prescriptions  Pending Prescriptions Disp Refills  . albuterol (VENTOLIN HFA) 108 (90 Base) MCG/ACT inhaler [Pharmacy Med Name: ALBUTEROL SULFATE HFA 108 (90 BASE)] 8.5 g 0    Sig: INAHLE 1 TO 2 PUFFS INTO THE LUNGS EVERY6 HOURS AS NEEDED FOR WHEEZING OR SHORTNESS OF BREATH     Pulmonology:  Beta Agonists 2 Passed - 07/25/2022  2:24 PM      Passed - Last BP in normal range    BP Readings from Last 1 Encounters:  04/06/22 132/64         Passed - Last Heart Rate in normal range    Pulse Readings from Last 1 Encounters:  04/06/22 73         Passed - Valid encounter within last 12 months    Recent Outpatient Visits          3 months ago Fatty liver disease, nonalcoholic   [place], Coralie Keens, NP   9 months ago Encounter for general adult medical examination with abnormal findings   [facility] [place], Coralie Keens, NP   1 year ago Overweight with body mass index (BMI) of 29 to 29.9 in adult   [facility] [place], Coralie Keens, [place]

## 2022-08-16 ENCOUNTER — Other Ambulatory Visit: Payer: Self-pay | Admitting: Internal Medicine

## 2022-08-16 NOTE — Telephone Encounter (Signed)
Requested Prescriptions  Pending Prescriptions Disp Refills  . losartan-hydrochlorothiazide (HYZAAR) 100-12.5 MG tablet [Pharmacy Med Name: LOSARTAN POTASSIUM-HCTZ 100-12.5 MG] 90 tablet 0    Sig: TAKE 1 TABLET BY MOUTH ONCE A DAY. PER MD MUST SCHEDULE PHYSICAL EXAM.     Cardiovascular: ARB + Diuretic Combos Passed - 08/16/2022 10:17 AM      Passed - K in normal range and within 180 days    Potassium  Date Value Ref Range Status  04/06/2022 3.7 3.5 - 5.3 mmol/L Final         Passed - Na in normal range and within 180 days    Sodium  Date Value Ref Range Status  04/06/2022 139 135 - 146 mmol/L Final         Passed - Cr in normal range and within 180 days    Creat  Date Value Ref Range Status  04/06/2022 0.94 0.70 - 1.35 mg/dL Final   Creatinine, Urine  Date Value Ref Range Status  04/06/2022 100 20 - 320 mg/dL Final         Passed - eGFR is 10 or above and within 180 days    GFR calc Af Amer  Date Value Ref Range Status  07/05/2020 >60 >60 mL/min Final   GFR calc non Af Amer  Date Value Ref Range Status  07/05/2020 58 (L) >60 mL/min Final   GFR  Date Value Ref Range Status  09/27/2020 92.00 >60.00 mL/min Final    Comment:    Calculated using the CKD-EPI Creatinine Equation (2021)   eGFR  Date Value Ref Range Status  04/06/2022 89 > OR = 60 mL/min/1.62m2 Final    Comment:    The eGFR is based on the CKD-EPI 2021 equation. To calculate  the new eGFR from a previous Creatinine or Cystatin C result, go to https://www.kidney.org/professionals/ kdoqi/gfr%5Fcalculator          Passed - Patient is not pregnant      Passed - Last BP in normal range    BP Readings from Last 1 Encounters:  04/06/22 132/64         Passed - Valid encounter within last 6 months    Recent Outpatient Visits          4 months ago Fatty liver disease, nonalcoholic   [place], Coralie Keens, NP   10 months ago Encounter for general adult medical examination with  abnormal findings   [facility] [place], Coralie Keens, NP   1 year ago Overweight with body mass index (BMI) of 29 to 29.9 in adult   [facility] [place], Coralie Keens, [place]

## 2022-09-11 DIAGNOSIS — M720 Palmar fascial fibromatosis [Dupuytren]: Secondary | ICD-10-CM | POA: Diagnosis not present

## 2022-10-09 ENCOUNTER — Other Ambulatory Visit: Payer: Self-pay | Admitting: Internal Medicine

## 2022-10-09 NOTE — Telephone Encounter (Signed)
Requested Prescriptions  Pending Prescriptions Disp Refills   omeprazole (PRILOSEC) 20 MG capsule [Pharmacy Med Name: OMEPRAZOLE DR 20 MG CAP] 90 capsule 1    Sig: TAKE 1 CAPSULE BY MOUTH DAILY AS NEEDED     Gastroenterology: Proton Pump Inhibitors Passed - 10/09/2022 11:50 AM      Passed - Valid encounter within last 12 months    Recent Outpatient Visits           6 months ago Fatty liver disease, nonalcoholic   [place], NP   1 year ago Encounter for general adult medical examination with abnormal findings   [facility] [place], Coralie Keens, NP   1 year ago Overweight with body mass index (BMI) of 29 to 29.9 in adult   [facility] [place], Coralie Keens, [place]

## 2022-11-14 ENCOUNTER — Other Ambulatory Visit: Payer: Self-pay | Admitting: Internal Medicine

## 2022-11-14 NOTE — Telephone Encounter (Signed)
Patient needs OV, will refill medication until OV can be made. OV is needed for additional refills.  Requested Prescriptions  Pending Prescriptions Disp Refills   losartan-hydrochlorothiazide (HYZAAR) 100-12.5 MG tablet [Pharmacy Med Name: LOSARTAN POTASSIUM-HCTZ 100-12.5 MG] 30 tablet 0    Sig: TAKE 1 TABLET BY MOUTH ONCE A DAY. PER MD MUST SCHEDULE PHYSICAL EXAM.     Cardiovascular: ARB + Diuretic Combos Failed - 11/14/2022 11:04 AM      Failed - K in normal range and within 180 days    Potassium  Date Value Ref Range Status  04/06/2022 3.7 3.5 - 5.3 mmol/L Final         Failed - Na in normal range and within 180 days    Sodium  Date Value Ref Range Status  04/06/2022 139 135 - 146 mmol/L Final         Failed - Cr in normal range and within 180 days    Creat  Date Value Ref Range Status  04/06/2022 0.94 0.70 - 1.35 mg/dL Final   Creatinine, Urine  Date Value Ref Range Status  04/06/2022 100 20 - 320 mg/dL Final         Failed - eGFR is 10 or above and within 180 days    GFR calc Af Amer  Date Value Ref Range Status  07/05/2020 >60 >60 mL/min Final   GFR calc non Af Amer  Date Value Ref Range Status  07/05/2020 58 (L) >60 mL/min Final   GFR  Date Value Ref Range Status  09/27/2020 92.00 >60.00 mL/min Final    Comment:    Calculated using the CKD-EPI Creatinine Equation (2021)   eGFR  Date Value Ref Range Status  04/06/2022 89 > OR = 60 mL/min/1.5m Final    Comment:    The eGFR is based on the CKD-EPI 2021 equation. To calculate  the new eGFR from a previous Creatinine or Cystatin C result, go to https://www.kidney.org/professionals/ kdoqi/gfr%5Fcalculator          Failed - Valid encounter within last 6 months    Recent Outpatient Visits           7 months ago Fatty liver disease, nonalcoholic   [place]   1 year ago Encounter for general adult medical examination with abnormal findings   [facility] [place]   1 year ago Overweight with body mass index (BMI) of 29 [place] 29.9 in adult   [place]             Passed - Patient is not pregnant      Passed - Last BP in normal range    BP Readings from Last 1 Encounters:  04/06/22 132/64

## 2022-12-14 ENCOUNTER — Other Ambulatory Visit: Payer: Self-pay | Admitting: Internal Medicine

## 2022-12-14 NOTE — Telephone Encounter (Signed)
Requested medication (s) are due for refill today -yes  Requested medication (s) are on the active medication list -yes  Future visit scheduled -no  Last refill: 11/14/22 #30  Notes to clinic: Courtesy RF with last fill- sent for provider review    Requested Prescriptions  Pending Prescriptions Disp Refills   losartan-hydrochlorothiazide (HYZAAR) 100-12.5 MG tablet [Pharmacy Med Name: LOSARTAN POTASSIUM-HCTZ 100-12.5 MG] 30 tablet 0    Sig: TAKE 1 TABLET BY MOUTH ONCE A DAY. PER MD MUST SCHEDULE PHYSICAL EXAM.     Cardiovascular: ARB + Diuretic Combos Failed - 12/14/2022 11:16 AM      Failed - K in normal range and within 180 days    Potassium  Date Value Ref Range Status  04/06/2022 3.7 3.5 - 5.3 mmol/L Final         Failed - Na in normal range and within 180 days    Sodium  Date Value Ref Range Status  04/06/2022 139 135 - 146 mmol/L Final         Failed - Cr in normal range and within 180 days    Creat  Date Value Ref Range Status  04/06/2022 0.94 0.70 - 1.35 mg/dL Final   Creatinine, Urine  Date Value Ref Range Status  04/06/2022 100 20 - 320 mg/dL Final         Failed - eGFR is 10 or above and within 180 days    GFR calc Af Amer  Date Value Ref Range Status  07/05/2020 >60 >60 mL/min Final   GFR calc non Af Amer  Date Value Ref Range Status  07/05/2020 58 (L) >60 mL/min Final   GFR  Date Value Ref Range Status  09/27/2020 92.00 >60.00 mL/min Final    Comment:    Calculated using the CKD-EPI Creatinine Equation (2021)   eGFR  Date Value Ref Range Status  04/06/2022 89 > OR = 60 mL/min/1.27m Final    Comment:    The eGFR is based on the CKD-EPI 2021 equation. To calculate  the new eGFR from a previous Creatinine or Cystatin C result, go to https://www.kidney.org/professionals/ kdoqi/gfr%5Fcalculator          Failed - Valid encounter within last 6 months    Recent Outpatient Visits           8 months ago Fatty liver disease, nonalcoholic   [facility]   1 year ago Encounter for general adult medical examination with abnormal findings   [facility] [place]   1 year ago Overweight with body mass index (BMI) of 29 to 29.9 in adult   [place]             Passed - Patient is not pregnant      Passed - Last BP in normal range    BP Readings from Last 1 Encounters:  04/06/22 132/64            Requested Prescriptions  Pending Prescriptions Disp Refills   losartan-hydrochlorothiazide (HYZAAR) 100-12.5 MG tablet [Pharmacy Med Name: LOSARTAN POTASSIUM-HCTZ 100-12.5 MG] 30 tablet 0    Sig: TAKE 1 TABLET BY MOUTH ONCE A DAY. PER MD MUST SCHEDULE PHYSICAL EXAM.     Cardiovascular: ARB + Diuretic Combos Failed - 12/14/2022 11:16 AM      Failed - K in normal range and within 180 days  Potassium  Date Value Ref Range Status  04/06/2022 3.7 3.5 - 5.3 mmol/L Final         Failed - Na in normal range and within 180 days    Sodium  Date Value Ref Range Status  04/06/2022 139 135 - 146 mmol/L Final         Failed - Cr in normal range and within 180 days    Creat  Date Value Ref Range Status  04/06/2022 0.94 0.70 - 1.35 mg/dL Final   Creatinine, Urine  Date Value Ref Range Status  04/06/2022 100 20 - 320 mg/dL Final         Failed - eGFR is 10 or above and within 180 days    GFR calc Af Amer  Date Value Ref Range Status  07/05/2020 >60 >60 mL/min Final   GFR calc non Af Amer  Date Value Ref Range Status  07/05/2020 58 (L) >60 mL/min Final   GFR  Date Value Ref Range Status  09/27/2020 92.00 >60.00 mL/min Final    Comment:    Calculated using the CKD-EPI Creatinine Equation (2021)   eGFR  Date Value Ref Range Status  04/06/2022 89 > OR = 60 mL/min/1.41m Final    Comment:    The eGFR is based on the CKD-EPI 2021 equation. To calculate  the new eGFR from a previous  Creatinine or Cystatin C result, go to https://www.kidney.org/professionals/ kdoqi/gfr%5Fcalculator          Failed - Valid encounter within last 6 months    Recent Outpatient Visits           8 months ago Fatty liver disease, nonalcoholic   [facility]   1 year ago Encounter for general adult medical examination with abnormal findings   [facility] [place]   1 year ago Overweight with body mass index (BMI) of 29 to 29.9 in adult   [place]             Passed - Patient is not pregnant      Passed - Last BP in normal range    BP Readings from Last 1 Encounters:  04/06/22 132/64

## 2022-12-20 ENCOUNTER — Encounter: Payer: Self-pay | Admitting: Internal Medicine

## 2022-12-20 ENCOUNTER — Ambulatory Visit (INDEPENDENT_AMBULATORY_CARE_PROVIDER_SITE_OTHER): Payer: Medicare HMO | Admitting: Internal Medicine

## 2022-12-20 VITALS — BP 130/78 | HR 61 | Temp 96.9°F | Ht 70.0 in | Wt 195.0 lb

## 2022-12-20 DIAGNOSIS — R252 Cramp and spasm: Secondary | ICD-10-CM | POA: Diagnosis not present

## 2022-12-20 DIAGNOSIS — Z125 Encounter for screening for malignant neoplasm of prostate: Secondary | ICD-10-CM

## 2022-12-20 DIAGNOSIS — Z6827 Body mass index (BMI) 27.0-27.9, adult: Secondary | ICD-10-CM | POA: Diagnosis not present

## 2022-12-20 DIAGNOSIS — E663 Overweight: Secondary | ICD-10-CM | POA: Diagnosis not present

## 2022-12-20 DIAGNOSIS — E119 Type 2 diabetes mellitus without complications: Secondary | ICD-10-CM | POA: Diagnosis not present

## 2022-12-20 DIAGNOSIS — Z0001 Encounter for general adult medical examination with abnormal findings: Secondary | ICD-10-CM | POA: Diagnosis not present

## 2022-12-20 MED ORDER — LOSARTAN POTASSIUM-HCTZ 100-12.5 MG PO TABS: ORAL_TABLET | ORAL | 1 refills | Status: DC

## 2022-12-20 MED ORDER — ALBUTEROL SULFATE HFA 108 (90 BASE) MCG/ACT IN AERS: INHALATION_SPRAY | RESPIRATORY_TRACT | 2 refills | Status: DC

## 2022-12-20 NOTE — Progress Notes (Signed)
Subjective:    Patient ID: Nathan Conway, male    DOB: 10-23-1956, 67 y.o.   MRN: [id number]  HPI  Patient presents to clinic today for his annual exam.  Flu: 09/2020 Tetanus: 04/2014 COVID: Alphonsa Overall x 1 Pneumovax: 04/2018 Prevnar: 09/2020 Zostavax: 12/2011 Shingrix: Never PSA screening: 10/2021 Colon screening: 09/2020 Vision screening: annually Dentist: biannually  Diet: He does eat meat. He consumes fruits and veggies. He tries to avoid fried foods. He drinks mostly Vitamin water, protein drinks and waters Exercise: None  Review of Systems  Past Medical History:  Diagnosis Date   Adenomatous colon polyp    Allergy    Asthma    Blood transfusion without reported diagnosis    20-30 yrs ago but not 100% sure    Chicken pox    Diverticulosis    Hyperlipidemia    Hypertension    Personal history of kidney stones    passed stones - no surgery    Current Outpatient Medications  Medication Sig Dispense Refill   albuterol (VENTOLIN HFA) 108 (90 Base) MCG/ACT inhaler INAHLE 1 TO 2 PUFFS INTO THE LUNGS EVERY6 HOURS AS NEEDED FOR WHEEZING OR SHORTNESS OF BREATH 8.5 g 0   aspirin 81 MG tablet Take 81 mg by mouth daily.     cholecalciferol (VITAMIN D) 1000 units tablet Take 1,000 Units by mouth daily.     ezetimibe (ZETIA) 10 MG tablet Take 1 tablet (10 mg total) by mouth daily. 90 tablet 1   fluticasone furoate-vilanterol (BREO ELLIPTA) 200-25 MCG/INH AEPB Inhale 2 puffs into the lungs daily.      losartan-hydrochlorothiazide (HYZAAR) 100-12.5 MG tablet TAKE 1 TABLET BY MOUTH ONCE A DAY. PER MD MUST SCHEDULE PHYSICAL EXAM. 30 tablet 0   omeprazole (PRILOSEC) 20 MG capsule TAKE 1 CAPSULE BY MOUTH DAILY AS NEEDED 90 capsule 1   simvastatin (ZOCOR) 20 MG tablet TAKE 1 TABLET BY MOUTH ONCE A DAY AT 6 PM 90 tablet 3   No current facility-administered medications for this visit.    No Known Allergies  Family History  Problem Relation Age of Onset   Hypertension Mother     Alcohol abuse Father    Lung cancer Father    Diabetes Neg Hx    Heart disease Neg Hx    Stroke Neg Hx    Colon cancer Neg Hx    Esophageal cancer Neg Hx    Rectal cancer Neg Hx    Stomach cancer Neg Hx    Colon polyps Neg Hx     Social History   Socioeconomic History   Marital status: Married    Spouse name: Not on file   Number of children: Not on file   Years of education: Not on file   Highest education level: Not on file  Occupational History   Not on file  Tobacco Use   Smoking status: Former    Packs/day: 2.00    Types: Cigarettes    Quit date: 11/05/1977    Years since quitting: 45.1   Smokeless tobacco: Never  Substance and Sexual Activity   Alcohol use: Not Currently    Alcohol/week: 2.0 standard drinks of alcohol    Types: 2 Shots of liquor per week   Drug use: No   Sexual activity: Not Currently  Other Topics Concern   Not on file  Social History Narrative   Not on file   Social Determinants of Health   Financial Resource Strain: Not on file  Food Insecurity: Not  on file  Transportation Needs: Not on file  Physical Activity: Not on file  Stress: Not on file  Social Connections: Not on file  Intimate Partner Violence: Not on file     Constitutional: Denies fever, malaise, fatigue, headache or abrupt weight changes.  HEENT: Denies eye pain, eye redness, ear pain, ringing in the ears, wax buildup, runny nose, nasal congestion, bloody nose, or sore throat. Respiratory: Patient reports intermittent shortness of breath.  Denies difficulty breathing, cough or sputum production.   Cardiovascular: Denies chest pain, chest tightness, palpitations or swelling in the hands or feet.  Gastrointestinal: Pt reports intermittent reflux. Denies abdominal pain, bloating, constipation, diarrhea or blood in the stool.  GU: Pt reports slow urine stream. Denies urgency, frequency, pain with urination, burning sensation, blood in urine, odor or discharge. Musculoskeletal:  Denies decrease in range of motion, difficulty with gait, muscle pain or joint pain and swelling.  Skin: Denies redness, rashes, lesions or ulcercations.  Neurological: Denies dizziness, difficulty with memory, difficulty with speech or problems with balance and coordination.  Psych: Denies anxiety, depression, SI/HI.  No other specific complaints in a complete review of systems (except as listed in HPI above).     Objective:   Physical Exam   BP 130/78 (BP Location: Right Arm, Patient Position: Sitting, Cuff Size: Large)   Pulse 61   Temp (!) 96.9 F (36.1 C) (Temporal)   Ht 5' 10"$  (1.778 m)   Wt 195 lb (88.5 kg)   SpO2 99%   BMI 27.98 kg/m   Wt Readings from Last 3 Encounters:  04/06/22 194 lb (88 kg)  10/06/21 195 lb 12.8 oz (88.8 kg)  06/27/21 197 lb (89.4 kg)    General: Appears his stated age, overweight, in NAD. Skin: Warm, dry and intact. No rashes noted. HEENT: Head: normal shape and size; Eyes: sclera white, no icterus, conjunctiva pink, PERRLA and EOMs intact;  Neck:  Neck supple, trachea midline. No masses, lumps or thyromegaly present.  Cardiovascular: Normal rate and rhythm. S1,S2 noted.  No murmur, rubs or gallops noted. No JVD or BLE edema. No carotid bruits noted. Pulmonary/Chest: Normal effort and positive vesicular breath sounds. No respiratory distress. No wheezes, rales or ronchi noted.  Abdomen: Normal bowel sounds.  Musculoskeletal: Strength 5/5 BUE/BLE.  No difficulty with gait.  Neurological: Alert and oriented. Cranial nerves II-XII grossly intact. Coordination normal.  Psychiatric: Mood and affect normal. Behavior is normal. Judgment and thought content normal.    BMET    Component Value Date/Time   NA 139 04/06/2022 1354   K 3.7 04/06/2022 1354   CL 105 04/06/2022 1354   CO2 24 04/06/2022 1354   GLUCOSE 124 04/06/2022 1354   BUN 19 04/06/2022 1354   CREATININE 0.94 04/06/2022 1354   CALCIUM 9.1 04/06/2022 1354   GFRNONAA 58 (L) 07/05/2020  1402   GFRAA >60 07/05/2020 1402    Lipid Panel     Component Value Date/Time   CHOL 122 04/06/2022 1354   TRIG 295 (H) 04/06/2022 1354   HDL 44 04/06/2022 1354   CHOLHDL 2.8 04/06/2022 1354   VLDL 17.8 09/27/2020 1536   LDLCALC 47 04/06/2022 1354    CBC    Component Value Date/Time   WBC 7.2 10/06/2021 1414   RBC 4.70 10/06/2021 1414   HGB 15.3 10/06/2021 1414   HCT 44.5 10/06/2021 1414   PLT 174 10/06/2021 1414   MCV 94.7 10/06/2021 1414   MCH 32.6 10/06/2021 1414   MCHC 34.4 10/06/2021  1414   RDW 12.2 10/06/2021 1414   LYMPHSABS 1.9 07/05/2020 1402   MONOABS 1.3 (H) 07/05/2020 1402   EOSABS 0.1 07/05/2020 1402   BASOSABS 0.1 07/05/2020 1402    Hgb A1C Lab Results  Component Value Date   HGBA1C 6.4 (A) 04/06/2022          Assessment & Plan:   Preventative Health Maintenance:  Encouraged him to get a flu shot in fall Tetanus UTD Encouraged him to get his COVID booster He declines Prevnar 20 today Discussed Shingrix vaccine, he will check coverage with his insurance company schedule visit if he would like to have this done Colon screening UTD Encouraged him to consume a balanced diet and exercise regimen Advised him to see an eye doctor and dentist annually We will check CBC, c-Met, lipid, A1c and PSA today  RTC in 6 months, follow-up chronic conditions Webb [place], NP

## 2022-12-20 NOTE — Patient Instructions (Signed)
Health Maintenance After Age 69 After age 69, you are at a higher risk for certain long-term diseases and infections as well as injuries from falls. Falls are a major cause of broken bones and head injuries in people who are older than age 69. Getting regular preventive care can help to keep you healthy and well. Preventive care includes getting regular testing and making lifestyle changes as recommended by your health care provider. Talk with your health care provider about: Which screenings and tests you should have. A screening is a test that checks for a disease when you have no symptoms. A diet and exercise plan that is right for you. What should I know about screenings and tests to prevent falls? Screening and testing are the best ways to find a health problem early. Early diagnosis and treatment give you the best chance of managing medical conditions that are common after age 69. Certain conditions and lifestyle choices may make you more likely to have a fall. Your health care provider may recommend: Regular vision checks. Poor vision and conditions such as cataracts can make you more likely to have a fall. If you wear glasses, make sure to get your prescription updated if your vision changes. Medicine review. Work with your health care provider to regularly review all of the medicines you are taking, including over-the-counter medicines. Ask your health care provider about any side effects that may make you more likely to have a fall. Tell your health care provider if any medicines that you take make you feel dizzy or sleepy. Strength and balance checks. Your health care provider may recommend certain tests to check your strength and balance while standing, walking, or changing positions. Foot health exam. Foot pain and numbness, as well as not wearing proper footwear, can make you more likely to have a fall. Screenings, including: Osteoporosis screening. Osteoporosis is a condition that causes  the bones to get weaker and break more easily. Blood pressure screening. Blood pressure changes and medicines to control blood pressure can make you feel dizzy. Depression screening. You may be more likely to have a fall if you have a fear of falling, feel depressed, or feel unable to do activities that you used to do. Alcohol use screening. Using too much alcohol can affect your balance and may make you more likely to have a fall. Follow these instructions at home: Lifestyle Do not drink alcohol if: Your health care provider tells you not to drink. If you drink alcohol: Limit how much you have to: 0-1 drink a day for women. 0-2 drinks a day for men. Know how much alcohol is in your drink. In the [place] one drink equals one 12 oz bottle of beer (355 mL), one 5 oz glass of wine (148 mL), or one 1 oz glass of hard liquor (44 mL). Do not use any products that contain nicotine or tobacco. These products include cigarettes, chewing tobacco, and vaping devices, such as e-cigarettes. If you need help quitting, ask your health care provider. Activity  Follow a regular exercise program to stay fit. This will help you maintain your balance. Ask your health care provider what types of exercise are appropriate for you. If you need a cane or walker, use it as recommended by your health care provider. Wear supportive shoes that have nonskid soles. Safety  Remove any tripping hazards, such as rugs, cords, and clutter. Install safety equipment such as grab bars in bathrooms and safety rails on stairs. Keep rooms and walkways   well-lit. General instructions Talk with your health care provider about your risks for falling. Tell your health care provider if: You fall. Be sure to tell your health care provider about all falls, even ones that seem minor. You feel dizzy, tiredness (fatigue), or off-balance. Take over-the-counter and prescription medicines only as told by your health care provider. These include  supplements. Eat a healthy diet and maintain a healthy weight. A healthy diet includes low-fat dairy products, low-fat (lean) meats, and fiber from whole grains, beans, and lots of fruits and vegetables. Stay current with your vaccines. Schedule regular health, dental, and eye exams. Summary Having a healthy lifestyle and getting preventive care can help to protect your health and wellness after age 69. Screening and testing are the best way to find a health problem early and help you avoid having a fall. Early diagnosis and treatment give you the best chance for managing medical conditions that are more common for people who are older than age 69. Falls are a major cause of broken bones and head injuries in people who are older than age 69. Take precautions to prevent a fall at home. Work with your health care provider to learn what changes you can make to improve your health and wellness and to prevent falls. This information is not intended to replace advice given to you by your health care provider. Make sure you discuss any questions you have with your health care provider. Document Revised: 03/13/2021 Document Reviewed: 03/13/2021 Elsevier Patient Education  2023 Elsevier Inc.  

## 2022-12-20 NOTE — Assessment & Plan Note (Signed)
Encourage diet and exercise for weight loss 

## 2022-12-21 LAB — CBC
HCT: 37.9 % — ABNORMAL LOW (ref 38.5–50.0)
Hemoglobin: 13 g/dL — ABNORMAL LOW (ref 13.2–17.1)
MCH: 31.6 pg (ref 27.0–33.0)
MCHC: 34.3 g/dL (ref 32.0–36.0)
MCV: 92 fL (ref 80.0–100.0)
MPV: 10.7 fL (ref 7.5–12.5)
Platelets: 183 10*3/uL (ref 140–400)
RBC: 4.12 10*6/uL — ABNORMAL LOW (ref 4.20–5.80)
RDW: 13.3 % (ref 11.0–15.0)
WBC: 7.2 10*3/uL (ref 3.8–10.8)

## 2022-12-21 LAB — HEMOGLOBIN A1C
Hgb A1c MFr Bld: 6.4 % of total Hgb — ABNORMAL HIGH (ref <5.7)
Mean Plasma Glucose: 137 mg/dL
eAG (mmol/L): 7.6 mmol/L

## 2022-12-21 LAB — COMPLETE METABOLIC PANEL WITH GFR
AG Ratio: 1.5 (calc) (ref 1.0–2.5)
ALT: 87 U/L — ABNORMAL HIGH (ref 9–46)
AST: 82 U/L — ABNORMAL HIGH (ref 10–35)
Albumin: 4.1 g/dL (ref 3.6–5.1)
Alkaline phosphatase (APISO): 50 U/L (ref 35–144)
BUN: 21 mg/dL (ref 7–25)
CO2: 25 mmol/L (ref 20–32)
Calcium: 9.2 mg/dL (ref 8.6–10.3)
Chloride: 102 mmol/L (ref 98–110)
Creat: 0.73 mg/dL (ref 0.70–1.35)
Globulin: 2.7 g/dL (calc) (ref 1.9–3.7)
Glucose, Bld: 104 mg/dL — ABNORMAL HIGH (ref 65–99)
Potassium: 4.1 mmol/L (ref 3.5–5.3)
Sodium: 139 mmol/L (ref 135–146)
Total Bilirubin: 0.6 mg/dL (ref 0.2–1.2)
Total Protein: 6.8 g/dL (ref 6.1–8.1)
eGFR: 99 mL/min/{1.73_m2} (ref >=60)

## 2022-12-21 LAB — LIPID PANEL
Cholesterol: 163 mg/dL (ref <200)
HDL: 69 mg/dL (ref >=40)
LDL Cholesterol (Calc): 74 mg/dL (calc)
Non-HDL Cholesterol (Calc): 94 mg/dL (calc) (ref <130)
Total CHOL/HDL Ratio: 2.4 (calc) (ref <5.0)
Triglycerides: 122 mg/dL (ref <150)

## 2022-12-21 LAB — PSA: PSA: 1.78 ng/mL (ref <=4.00)

## 2022-12-21 LAB — MAGNESIUM: Magnesium: 1.9 mg/dL (ref 1.5–2.5)

## 2023-01-03 ENCOUNTER — Encounter: Payer: Self-pay | Admitting: Internal Medicine

## 2023-01-14 ENCOUNTER — Other Ambulatory Visit: Payer: Self-pay | Admitting: Internal Medicine

## 2023-01-15 NOTE — Telephone Encounter (Signed)
Requested Prescriptions  Pending Prescriptions Disp Refills   simvastatin (ZOCOR) 20 MG tablet [Pharmacy Med Name: SIMVASTATIN '20MG'$  TABLET] 90 tablet 0    Sig: TAKE ONE TABLET BY MOUTH ONCE A DAY AT SIX IN THE EVENING     Cardiovascular:  Antilipid - Statins Failed - 01/14/2023  4:53 PM      Failed - Lipid Panel in normal range within the last 12 months    Cholesterol  Date Value Ref Range Status  12/20/2022 163 <200 mg/dL Final   LDL Cholesterol (Calc)  Date Value Ref Range Status  12/20/2022 74 mg/dL (calc) Final    Comment:    Reference range: <100 . Desirable range <100 mg/dL for primary prevention;   <70 mg/dL for patients with CHD or diabetic patients  with > or = 2 CHD risk factors. Marland Kitchen LDL-C is now calculated using the Martin-Hopkins  calculation, which is a validated novel method providing  better accuracy than the Friedewald equation in the  estimation of LDL-C.  Cresenciano Genre et al. Annamaria Helling. [id number]): 2061-2068  (http://education.[employer].com/faq/FAQ164)    Direct LDL  Date Value Ref Range Status  09/07/2016 74.0 mg/dL Final    Comment:    Optimal:  <100 mg/dLNear or Above Optimal:  100-129 mg/dLBorderline High:  130-159 mg/dLHigh:  160-189 mg/dLVery High:  >190 mg/dL   HDL  Date Value Ref Range Status  12/20/2022 69 > OR = 40 mg/dL Final   Triglycerides  Date Value Ref Range Status  12/20/2022 122 <150 mg/dL Final         Passed - Patient is not pregnant      Passed - Valid encounter within last 12 months    Recent Outpatient Visits           3 weeks ago Encounter for general adult medical examination with abnormal findings   [facility] [place], Coralie Keens, NP   9 months ago Fatty liver disease, nonalcoholic   [facility] [place], Coralie Keens, NP   1 year ago Encounter for general adult medical examination with abnormal findings   [facility] [place], [place] W, NP   1 year  ago Overweight with body mass index (BMI) of 29 to 29.9 in adult   [facility] [place], Coralie Keens, NP

## 2023-02-11 ENCOUNTER — Other Ambulatory Visit: Payer: Self-pay | Admitting: Internal Medicine

## 2023-02-12 NOTE — Telephone Encounter (Signed)
Requested Prescriptions  Refused Prescriptions Disp Refills   omeprazole (PRILOSEC) 20 MG capsule [Pharmacy Med Name: OMEPRAZOLE 20MG  CAPSULE DR] 90 capsule 1    Sig: TAKE ONE CAPSULE BY MOUTH DAILY AS NEEDED     Gastroenterology: Proton Pump Inhibitors Passed - 02/11/2023 12:23 PM      Passed - Valid encounter within last 12 months    Recent Outpatient Visits           1 month ago Encounter for general adult medical examination with abnormal findings   [place] [facility] [place], Salvadore Oxford, NP   10 months ago Fatty liver disease, nonalcoholic   [place] [facility] [place], Salvadore Oxford, NP   1 year ago Encounter for general adult medical examination with abnormal findings   [place] [facility] [place], [place] W, NP   1 year ago Overweight with body mass index (BMI) of 29 to 29.9 in adult   [facility] [place], Salvadore Oxford, NP

## 2023-03-13 ENCOUNTER — Other Ambulatory Visit: Payer: Self-pay | Admitting: Internal Medicine

## 2023-03-13 NOTE — Telephone Encounter (Signed)
Unable to refill per protocol, Rx request is too soon. Last refill 10/09/22 for 90 and 1 refill.  Requested Prescriptions  Pending Prescriptions Disp Refills   omeprazole (PRILOSEC) 20 MG capsule [Pharmacy Med Name: OMEPRAZOLE 20MG  CAPSULE DR] 90 capsule 1    Sig: TAKE ONE CAPSULE BY MOUTH DAILY AS NEEDED     Gastroenterology: Proton Pump Inhibitors Passed - 03/13/2023  1:04 PM      Passed - Valid encounter within last 12 months    Recent Outpatient Visits           2 months ago Encounter for general adult medical examination with abnormal findings   [place] [facility] [place], Salvadore Oxford, NP   11 months ago Fatty liver disease, nonalcoholic   [facility] [place], Salvadore Oxford, NP   1 year ago Encounter for general adult medical examination with abnormal findings   [facility] [place], [place] W, NP   1 year ago Overweight with body mass index (BMI) of 29 to 29.9 in adult   [facility] [place], Salvadore Oxford, NP

## 2023-04-12 ENCOUNTER — Ambulatory Visit: Payer: Medicare HMO

## 2023-06-20 ENCOUNTER — Telehealth: Payer: Self-pay | Admitting: Internal Medicine

## 2023-06-20 ENCOUNTER — Ambulatory Visit: Payer: Medicare HMO

## 2023-06-20 NOTE — Telephone Encounter (Signed)
LVM 06/20/23 at 1011am to r/s AWV due to [facility] lost her voice. New date 06/27/23 please confirm date change. [facility]   Verlee Rossetti; Care Guide Ambulatory Clinical Support [place] [facility] Direct Dial: [phone]

## 2023-06-23 ENCOUNTER — Other Ambulatory Visit: Payer: Self-pay | Admitting: Pharmacist

## 2023-06-23 NOTE — Progress Notes (Signed)
Pharmacy Quality Measure Review  This patient is appearing on a report for being at risk of failing the adherence measure for cholesterol (statin) medications this calendar year.   Medication: simvastatin 20 mg daily Last fill date: 01/15/23 for 90 day supply  Spoke to pt via phone. He is out of refills on simvastatin. Will send message to PCP. Also, reported being out of losartan/hydrochlorothiazide. Instructed pt to contact pharmacy to fill medication.  Adam Phenix, PharmD PGY-1 Pharmacy Resident

## 2023-06-27 ENCOUNTER — Ambulatory Visit (INDEPENDENT_AMBULATORY_CARE_PROVIDER_SITE_OTHER): Payer: Medicare HMO

## 2023-06-27 DIAGNOSIS — Z0000 Encounter for general adult medical examination without abnormal findings: Secondary | ICD-10-CM

## 2023-06-27 NOTE — Patient Instructions (Signed)
Nathan Conway , Thank you for taking time to come for your Medicare Wellness Visit. I appreciate your ongoing commitment to your health goals. Please review the following plan we discussed and let me know if I can assist you in the future.   Referrals/Orders/Follow-Ups/Clinician Recommendations: none  This is a list of the screening recommended for you and due dates:  Health Maintenance  Topic Date Due   Zoster (Shingles) Vaccine (1 of 2) 06/04/2004   COVID-19 Vaccine (2 - 2023-24 season) 07/06/2022   Yearly kidney health urinalysis for diabetes  04/07/2023   Eye exam for diabetics  04/07/2023   Pneumonia Vaccine (3 of 3 - PPSV23 or PCV20) 05/03/2023   Flu Shot  06/06/2023   Hemoglobin A1C  06/20/2023   Colon Cancer Screening  09/17/2023   Yearly kidney function blood test for diabetes  12/21/2023   Complete foot exam   12/21/2023   DTaP/Tdap/Td vaccine (2 - Td or Tdap) 04/13/2024   Medicare Annual Wellness Visit  06/26/2024   Hepatitis C Screening  Addressed   HPV Vaccine  Aged Out    Advanced directives: (ACP Link)Information on Advanced Care Planning can be found at [facility] Advance Health Care Directives Advance Health Care Directives ([email or web address]   Next Medicare Annual Wellness Visit scheduled for next year: Yes   07/02/24 @ 11:15 am by phone

## 2023-06-27 NOTE — Progress Notes (Signed)
Subjective:   Nathan Conway is a 67 y.o. male who presents for Medicare Annual/Subsequent preventive examination.  Visit Complete: Virtual  I connected with  Nathan Conway on 06/27/23 by a audio enabled telemedicine application and verified that I am speaking with the correct person using two identifiers.  Patient Location: Home  Provider Location: Office/Clinic  I discussed the limitations of evaluation and management by telemedicine. The patient expressed understanding and agreed to proceed.  Vital Signs: Unable to obtain new vitals due to this being a telehealth visit.  Review of Systems     Cardiac Risk Factors include: advanced age (>53men, >41 women);diabetes mellitus;dyslipidemia;hypertension;male gender     Objective:    There were no vitals filed for this visit. There is no height or weight on file to calculate BMI.     06/27/2023    1:36 PM 07/05/2020    2:00 PM 08/25/2019    1:32 PM 10/03/2015   10:02 AM 09/19/2015    8:14 AM  Advanced Directives  Does Patient Have a Medical Advance Directive? No No No No No  Would patient like information on creating a medical advance directive? No - Patient declined   No - patient declined information     Current Medications (verified) Outpatient Encounter Medications as of 06/27/2023  Medication Sig   albuterol (VENTOLIN HFA) 108 (90 Base) MCG/ACT inhaler INAHLE 1 TO 2 PUFFS INTO THE LUNGS EVERY6 HOURS AS NEEDED FOR WHEEZING OR SHORTNESS OF BREATH   aspirin 81 MG tablet Take 81 mg by mouth daily.   cholecalciferol (VITAMIN D) 1000 units tablet Take 1,000 Units by mouth daily.   ezetimibe (ZETIA) 10 MG tablet Take 1 tablet (10 mg total) by mouth daily.   fluticasone furoate-vilanterol (BREO ELLIPTA) 200-25 MCG/INH AEPB Inhale 2 puffs into the lungs daily.    losartan-hydrochlorothiazide (HYZAAR) 100-12.5 MG tablet TAKE 1 TABLET BY MOUTH ONCE A DAY. PER MD MUST SCHEDULE PHYSICAL EXAM.   omeprazole (PRILOSEC) 20 MG capsule TAKE  1 CAPSULE BY MOUTH DAILY AS NEEDED   simvastatin (ZOCOR) 20 MG tablet TAKE ONE TABLET BY MOUTH ONCE A DAY AT SIX IN THE EVENING   No facility-administered encounter medications on file as of 06/27/2023.    Allergies (verified) Patient has no known allergies.   History: Past Medical History:  Diagnosis Date   Adenomatous colon polyp    Allergy    Asthma    Blood transfusion without reported diagnosis    20-30 yrs ago but not 100% sure    Chicken pox    Diverticulosis    Hyperlipidemia    Hypertension    Personal history of kidney stones    passed stones - no surgery   Past Surgical History:  Procedure Laterality Date   COLONOSCOPY  2008   In [place] - polyp   HERNIA REPAIR     NASAL SINUS SURGERY     POLYPECTOMY     WISDOM TOOTH EXTRACTION     Family History  Problem Relation Age of Onset   Hypertension Mother    Alcohol abuse Father    Lung cancer Father    Colon cancer Brother    Diabetes Neg Hx    Heart disease Neg Hx    Stroke Neg Hx    Esophageal cancer Neg Hx    Rectal cancer Neg Hx    Stomach cancer Neg Hx    Social History   Socioeconomic History   Marital status: Married    Spouse name:  Not on file   Number of children: Not on file   Years of education: Not on file   Highest education level: Not on file  Occupational History   Not on file  Tobacco Use   Smoking status: Former    Current packs/day: 0.00    Types: Cigarettes    Quit date: 11/05/1977    Years since quitting: 45.6   Smokeless tobacco: Never  Substance and Sexual Activity   Alcohol use: Not Currently    Alcohol/week: 2.0 standard drinks of alcohol    Types: 2 Shots of liquor per week   Drug use: No   Sexual activity: Not Currently  Other Topics Concern   Not on file  Social History Narrative   Not on file   Social Determinants of Health   Financial Resource Strain: Low Risk  (06/27/2023)   Overall Financial Resource Strain (CARDIA)    Difficulty of Paying Living  Expenses: Not hard at all  Food Insecurity: No Food Insecurity (06/27/2023)   Hunger Vital Sign    Worried About Running Out of Food in the Last Year: Never true    Ran Out of Food in the Last Year: Never true  Transportation Needs: No Transportation Needs (06/27/2023)   PRAPARE - Administrator, Civil Service (Medical): No    Lack of Transportation (Non-Medical): No  Physical Activity: Sufficiently Active (06/27/2023)   Exercise Vital Sign    Days of Exercise per Week: 4 days    Minutes of Exercise per Session: 60 min  Stress: No Stress Concern Present (06/27/2023)   [employer] of Occupational Health - Occupational Stress Questionnaire    Feeling of Stress : Only a little  Social Connections: Unknown (06/27/2023)   Social Connection and Isolation Panel [NHANES]    Frequency of Communication with Friends and Family: Twice a week    Frequency of Social Gatherings with Friends and Family: Not on file    Attends Religious Services: Never    Database administrator or Organizations: Yes    Attends Engineer, structural: More than 4 times per year    Marital Status: Married    Tobacco Counseling Counseling given: Not Answered   Clinical Intake:  Pre-visit preparation completed: Yes  Pain : No/denies pain     Nutritional Risks: None Diabetes: Yes CBG done?: No Did pt. bring in CBG monitor from home?: No  How often do you need to have someone help you when you read instructions, pamphlets, or other written materials from your doctor or pharmacy?: 1 - Never  Interpreter Needed?: No  Information entered by :: Kennedy Bucker, LPN   Activities of Daily Living    06/27/2023    1:36 PM 12/20/2022    4:05 PM  In your present state of health, do you have any difficulty performing the following activities:  Hearing? 0 0  Vision? 0 0  Difficulty concentrating or making decisions? 0 0  Walking or climbing stairs? 0 0  Dressing or bathing? 0 0  Doing  errands, shopping? 0 0  Preparing Food and eating ? N   Using the Toilet? N   In the past six months, have you accidently leaked urine? N   Do you have problems with loss of bowel control? N   Managing your Medications? N   Managing your Finances? N   Housekeeping or managing your Housekeeping? N     Patient Care Team: Lorre Munroe, NP as PCP - General (  Internal Medicine)  Indicate any recent Medical Services you may have received from other than [facility] providers in the past year (date may be approximate).     Assessment:   This is a routine wellness examination for [place].  Hearing/Vision screen Hearing Screening - Comments:: No aids Vision Screening - Comments:: Wears glasses- Dr.Groat   Dietary issues and exercise activities discussed:     Goals Addressed             This Visit's Progress    DIET - EAT MORE FRUITS AND VEGETABLES         Depression Screen    06/27/2023    1:33 PM 12/20/2022    4:04 PM 04/06/2022    1:56 PM 10/06/2021    2:01 PM 09/27/2020    3:08 PM 08/07/2019    2:41 PM 05/02/2018    2:54 PM  PHQ 2/9 Scores  PHQ - 2 Score 0 0 0 0 0 0 0  PHQ- 9 Score 0  0 0 0      Fall Risk    06/27/2023    1:36 PM 12/20/2022    4:05 PM 04/06/2022    1:56 PM 10/06/2021    2:03 PM 10/06/2021    2:01 PM  Fall Risk   Falls in the past year? 0 0 0 0   Number falls in past yr: 0  0 0 0  Injury with Fall? 0 0 0 0 0  Risk for fall due to : No Fall Risks No Fall Risks No Fall Risks No Fall Risks No Fall Risks  Follow up Falls prevention discussed;Falls evaluation completed  Falls evaluation completed      MEDICARE RISK AT HOME: Medicare Risk at Home Any stairs in or around the home?: Yes If so, are there any without handrails?: No Home free of loose throw rugs in walkways, pet beds, electrical cords, etc?: Yes Adequate lighting in your home to reduce risk of falls?: Yes Life alert?: No Use of a cane, walker or w/c?: No Grab bars in the bathroom?: No Shower  chair or bench in shower?: No Elevated toilet seat or a handicapped toilet?: Yes  TIMED UP AND GO:  Was the test performed?  No    Cognitive Function:        Immunizations Immunization History  Administered Date(s) Administered   Influenza Inj Mdck Quad With Preservative 08/26/2018   Influenza,inj,Quad PF,6+ Mos 09/07/2016, 09/27/2020   Influenza-Unspecified 07/16/2014   Janssen (J&J) SARS-COV-2 Vaccination 01/13/2020   Pneumococcal Conjugate-13 05/18/2015, 09/27/2020   Pneumococcal Polysaccharide-23 05/02/2012, 05/02/2018   Tdap 04/13/2014   Zoster, Live 12/07/2011    TDAP status: Up to date  Flu Vaccine status: Declined, Education has been provided regarding the importance of this vaccine but patient still declined. Advised may receive this vaccine at local pharmacy or Health Dept. Aware to provide a copy of the vaccination record if obtained from local pharmacy or Health Dept. Verbalized acceptance and understanding.  Pneumococcal vaccine status: Up to date  Covid-19 vaccine status: Completed vaccines  Qualifies for Shingles Vaccine? Yes   Zostavax completed Yes   Shingrix Completed?: No.    Education has been provided regarding the importance of this vaccine. Patient has been advised to call insurance company to determine out of pocket expense if they have not yet received this vaccine. Advised may also receive vaccine at local pharmacy or Health Dept. Verbalized acceptance and understanding.  Screening Tests Health Maintenance  Topic Date Due   Zoster  Vaccines- Shingrix (1 of 2) 06/04/2004   COVID-19 Vaccine (2 - 2023-24 season) 07/06/2022   Diabetic kidney evaluation - Urine ACR  04/07/2023   OPHTHALMOLOGY EXAM  04/07/2023   Pneumonia Vaccine 79+ Years old (3 of 3 - PPSV23 or PCV20) 05/03/2023   INFLUENZA VACCINE  06/06/2023   HEMOGLOBIN A1C  06/20/2023   Colonoscopy  09/17/2023   Diabetic kidney evaluation - eGFR measurement  12/21/2023   FOOT EXAM  12/21/2023    DTaP/Tdap/Td (2 - Td or Tdap) 04/13/2024   Medicare Annual Wellness (AWV)  06/26/2024   Hepatitis C Screening  Addressed   HPV VACCINES  Aged Out    Health Maintenance  Health Maintenance Due  Topic Date Due   Zoster Vaccines- Shingrix (1 of 2) 06/04/2004   COVID-19 Vaccine (2 - 2023-24 season) 07/06/2022   Diabetic kidney evaluation - Urine ACR  04/07/2023   OPHTHALMOLOGY EXAM  04/07/2023   Pneumonia Vaccine 70+ Years old (3 of 3 - PPSV23 or PCV20) 05/03/2023   INFLUENZA VACCINE  06/06/2023   HEMOGLOBIN A1C  06/20/2023   Colonoscopy  09/17/2023    Colorectal cancer screening: Type of screening: Colonoscopy. Completed 09/16/20. Repeat every 3 years  Lung Cancer Screening: (Low Dose CT Chest recommended if Age 30-80 years, 20 pack-year currently smoking OR have quit w/in 15years.) does not qualify.    Additional Screening:  Hepatitis C Screening: does qualify; Completed 09/07/16  Vision Screening: Recommended annual ophthalmology exams for early detection of glaucoma and other disorders of the eye. Is the patient up to date with their annual eye exam?  Yes  Who is the provider or what is the name of the office in which the patient attends annual eye exams? Dr.Groat If pt is not established with a provider, would they like to be referred to a provider to establish care? No .   Dental Screening: Recommended annual dental exams for proper oral hygiene  Diabetic Foot Exam: Diabetic Foot Exam: Completed 12/20/22  Community Resource Referral / Chronic Care Management: CRR required this visit?  No   CCM required this visit?  No     Plan:     I have personally reviewed and noted the following in the patient's chart:   Medical and social history Use of alcohol, tobacco or illicit drugs  Current medications and supplements including opioid prescriptions. Patient is not currently taking opioid prescriptions. Functional ability and status Nutritional status Physical  activity Advanced directives List of other physicians Hospitalizations, surgeries, and ER visits in previous 12 months Vitals Screenings to include cognitive, depression, and falls Referrals and appointments  In addition, I have reviewed and discussed with patient certain preventive protocols, quality metrics, and best practice recommendations. A written personalized care plan for preventive services as well as general preventive health recommendations were provided to patient.     Hal Hope, LPN   1/61/0960   After Visit Summary: (MyChart) Due to this being a telephonic visit, the after visit summary with patients personalized plan was offered to patient via MyChart   Nurse Notes: none

## 2023-06-28 ENCOUNTER — Other Ambulatory Visit: Payer: Self-pay | Admitting: Internal Medicine

## 2023-06-28 NOTE — Telephone Encounter (Signed)
Requested Prescriptions  Pending Prescriptions Disp Refills   simvastatin (ZOCOR) 20 MG tablet [Pharmacy Med Name: SIMVASTATIN 20MG  TABLET] 90 tablet 1    Sig: TAKE ONE TABLET BY MOUTH ONCE A DAY AT SIX IN THE EVENING     Cardiovascular:  Antilipid - Statins Failed - 06/28/2023  9:19 AM      Failed - Lipid Panel in normal range within the last 12 months    Cholesterol  Date Value Ref Range Status  12/20/2022 163 <200 mg/dL Final   LDL Cholesterol (Calc)  Date Value Ref Range Status  12/20/2022 74 mg/dL (calc) Final    Comment:    Reference range: <100 . Desirable range <100 mg/dL for primary prevention;   <70 mg/dL for patients with CHD or diabetic patients  with > or = 2 CHD risk factors. Marland Kitchen LDL-C is now calculated using the Martin-Hopkins  calculation, which is a validated novel method providing  better accuracy than the Friedewald equation in the  estimation of LDL-C.  Horald Pollen et al. Lenox Ahr. 0981;191(47): 2061-2068  (http://education.[facility].com/faq/FAQ164)    Direct LDL  Date Value Ref Range Status  09/07/2016 74.0 mg/dL Final    Comment:    Optimal:  <100 mg/dLNear or Above Optimal:  100-129 mg/dLBorderline High:  130-159 mg/dLHigh:  160-189 mg/dLVery High:  >190 mg/dL   HDL  Date Value Ref Range Status  12/20/2022 69 > OR = 40 mg/dL Final   Triglycerides  Date Value Ref Range Status  12/20/2022 122 <150 mg/dL Final         Passed - Patient is not pregnant      Passed - Valid encounter within last 12 months    Recent Outpatient Visits           6 months ago Encounter for general adult medical examination with abnormal findings   [place] [facility] [place], Salvadore Oxford, NP   1 year ago Fatty liver disease, nonalcoholic   [place] [employer], Salvadore Oxford, NP   1 year ago Encounter for general adult medical examination with abnormal findings   [place] [facility] [place], [place] W, NP   2 years  ago Overweight with body mass index (BMI) of 29 to 29.9 in adult   [facility] [place], Salvadore Oxford, NP               omeprazole (PRILOSEC) 20 MG capsule [place] Med Name: OMEPRAZOLE 20MG  CAPSULE DR] 90 capsule 1    Sig: TAKE ONE CAPSULE BY MOUTH DAILY AS NEEDED     Gastroenterology: Proton Pump Inhibitors Passed - 06/28/2023  9:19 AM      Passed - Valid encounter within last 12 months    Recent Outpatient Visits           6 months ago Encounter for general adult medical examination with abnormal findings   [facility] [place], Salvadore Oxford, NP   1 year ago Fatty liver disease, nonalcoholic   [place] [facility] [place], Salvadore Oxford, NP   1 year ago Encounter for general adult medical examination with abnormal findings   [place] [facility] [place], [place] W, NP   2 years ago Overweight with body mass index (BMI) of 29 to 29.9 in adult   [facility] [place], Salvadore Oxford, [place]

## 2023-07-05 DIAGNOSIS — H1712 Central corneal opacity, left eye: Secondary | ICD-10-CM | POA: Diagnosis not present

## 2023-07-05 DIAGNOSIS — H25813 Combined forms of age-related cataract, bilateral: Secondary | ICD-10-CM | POA: Diagnosis not present

## 2023-07-05 DIAGNOSIS — H35033 Hypertensive retinopathy, bilateral: Secondary | ICD-10-CM | POA: Diagnosis not present

## 2023-07-05 LAB — HM DIABETES EYE EXAM

## 2023-09-11 ENCOUNTER — Encounter: Payer: Self-pay | Admitting: Gastroenterology

## 2023-10-07 ENCOUNTER — Other Ambulatory Visit: Payer: Self-pay | Admitting: Internal Medicine

## 2023-10-10 NOTE — Telephone Encounter (Signed)
Requested medications are due for refill today.  yes  Requested medications are on the active medications list.  yes  Last refill. 12/20/2022 #90 1 rf  Future visit scheduled.   no  Notes to clinic.  Pt is more than 3 months overdue for OV.    Requested Prescriptions  Pending Prescriptions Disp Refills   losartan-hydrochlorothiazide (HYZAAR) 100-12.5 MG tablet [Pharmacy Med Name: LOSARTAN POTASSIUM/HYDROCHLOROTHIAZIDE 100-12.5 TABLET] 90 tablet 1    Sig: TAKE ONE TABLET BY MOUTH ONCE A DAY. PER MD MUST SCHEDULE PHYSICAL EXAM.     Cardiovascular: ARB + Diuretic Combos Failed - 10/07/2023  3:56 PM      Failed - K in normal range and within 180 days    Potassium  Date Value Ref Range Status  12/20/2022 4.1 3.5 - 5.3 mmol/L Final         Failed - Na in normal range and within 180 days    Sodium  Date Value Ref Range Status  12/20/2022 139 135 - 146 mmol/L Final         Failed - Cr in normal range and within 180 days    Creat  Date Value Ref Range Status  12/20/2022 0.73 0.70 - 1.35 mg/dL Final   Creatinine, Urine  Date Value Ref Range Status  04/06/2022 100 20 - 320 mg/dL Final         Failed - eGFR is 10 or above and within 180 days    GFR calc Af Amer  Date Value Ref Range Status  07/05/2020 >60 >60 mL/min Final   GFR calc non Af Amer  Date Value Ref Range Status  07/05/2020 58 (L) >60 mL/min Final   GFR  Date Value Ref Range Status  09/27/2020 92.00 >60.00 mL/min Final    Comment:    Calculated using the CKD-EPI Creatinine Equation (2021)   eGFR  Date Value Ref Range Status  12/20/2022 99 > OR = 60 mL/min/1.49m2 Final         Failed - Valid encounter within last 6 months    Recent Outpatient Visits           9 months ago Encounter for general adult medical examination with abnormal findings   [facility] [place], Salvadore Oxford, NP   1 year ago Fatty liver disease, nonalcoholic   [place] [facility] [place], Salvadore Oxford, NP   2 years ago Encounter for general adult medical examination with abnormal findings    [facility] [place], Salvadore Oxford, NP   2 years ago Overweight with body mass index (BMI) of 29 to 29.9 in adult   [facility] [place] [facility] [place], [place], [place]              Passed - Patient is not pregnant      Passed - Last BP in normal range    BP Readings from Last 1 Encounters:  12/20/22 130/78

## 2023-11-04 DIAGNOSIS — Z03818 Encounter for observation for suspected exposure to other biological agents ruled out: Secondary | ICD-10-CM | POA: Diagnosis not present

## 2023-11-04 DIAGNOSIS — J101 Influenza due to other identified influenza virus with other respiratory manifestations: Secondary | ICD-10-CM | POA: Diagnosis not present

## 2023-11-04 DIAGNOSIS — R059 Cough, unspecified: Secondary | ICD-10-CM | POA: Diagnosis not present

## 2023-11-04 DIAGNOSIS — Z1152 Encounter for screening for COVID-19: Secondary | ICD-10-CM | POA: Diagnosis not present

## 2023-11-08 ENCOUNTER — Ambulatory Visit (INDEPENDENT_AMBULATORY_CARE_PROVIDER_SITE_OTHER): Payer: Medicare HMO | Admitting: Internal Medicine

## 2023-11-08 ENCOUNTER — Encounter: Payer: Self-pay | Admitting: Internal Medicine

## 2023-11-08 VITALS — BP 120/78 | Ht 70.0 in | Wt 193.2 lb

## 2023-11-08 DIAGNOSIS — J4530 Mild persistent asthma, uncomplicated: Secondary | ICD-10-CM

## 2023-11-08 DIAGNOSIS — I10 Essential (primary) hypertension: Secondary | ICD-10-CM | POA: Diagnosis not present

## 2023-11-08 DIAGNOSIS — E785 Hyperlipidemia, unspecified: Secondary | ICD-10-CM | POA: Diagnosis not present

## 2023-11-08 DIAGNOSIS — E663 Overweight: Secondary | ICD-10-CM

## 2023-11-08 DIAGNOSIS — Z6827 Body mass index (BMI) 27.0-27.9, adult: Secondary | ICD-10-CM

## 2023-11-08 DIAGNOSIS — E1169 Type 2 diabetes mellitus with other specified complication: Secondary | ICD-10-CM | POA: Diagnosis not present

## 2023-11-08 DIAGNOSIS — K760 Fatty (change of) liver, not elsewhere classified: Secondary | ICD-10-CM

## 2023-11-08 DIAGNOSIS — K219 Gastro-esophageal reflux disease without esophagitis: Secondary | ICD-10-CM | POA: Diagnosis not present

## 2023-11-08 DIAGNOSIS — D508 Other iron deficiency anemias: Secondary | ICD-10-CM | POA: Diagnosis not present

## 2023-11-08 DIAGNOSIS — E119 Type 2 diabetes mellitus without complications: Secondary | ICD-10-CM | POA: Diagnosis not present

## 2023-11-08 LAB — POCT GLYCOSYLATED HEMOGLOBIN (HGB A1C): Hemoglobin A1C: 6.7 % — AB (ref 4.0–5.6)

## 2023-11-08 MED ORDER — LOSARTAN POTASSIUM-HCTZ 100-12.5 MG PO TABS: ORAL_TABLET | ORAL | 1 refills | Status: DC

## 2023-11-08 MED ORDER — EZETIMIBE 10 MG PO TABS: 10.0000 mg | ORAL_TABLET | Freq: Every day | ORAL | 1 refills | Status: DC

## 2023-11-08 MED ORDER — SIMVASTATIN 20 MG PO TABS: ORAL_TABLET | ORAL | 1 refills | Status: DC

## 2023-11-08 MED ORDER — FLUTICASONE FUROATE-VILANTEROL 200-25 MCG/ACT IN AEPB: 1.0000 | INHALATION_SPRAY | Freq: Every day | RESPIRATORY_TRACT | 11 refills | Status: DC

## 2023-11-08 MED ORDER — OMEPRAZOLE 20 MG PO CPDR: 20.0000 mg | DELAYED_RELEASE_CAPSULE | Freq: Every day | ORAL | 1 refills | Status: DC

## 2023-11-08 MED ORDER — ALBUTEROL SULFATE HFA 108 (90 BASE) MCG/ACT IN AERS: INHALATION_SPRAY | RESPIRATORY_TRACT | 2 refills | Status: DC

## 2023-11-08 NOTE — Assessment & Plan Note (Signed)
Controlled on Losartan HCT Reinforced DASH diet and exercise weight loss C-Met today

## 2023-11-08 NOTE — Assessment & Plan Note (Signed)
Continue Breo and Albuterol We will monitor

## 2023-11-08 NOTE — Assessment & Plan Note (Signed)
 Encourage diet and exercise for weight loss

## 2023-11-08 NOTE — Assessment & Plan Note (Signed)
 C-Met and lipid profile today Encouraged him to consume a low-fat diet Continue Simvastatin and ezetimibe

## 2023-11-08 NOTE — Assessment & Plan Note (Signed)
Encourage low-fat diet and exercise for weight loss C-Met today

## 2023-11-08 NOTE — Assessment & Plan Note (Signed)
Avoid eating late at night Encourage weight loss as this can help reduce reflux symptoms Continue Omeprazole as needed

## 2023-11-08 NOTE — Patient Instructions (Signed)
Fatty Liver Disease  The liver converts food into energy, removes toxic material from the blood, makes important proteins, and absorbs necessary vitamins from food. Fatty liver disease occurs when too much fat has built up in your liver cells. Fatty liver disease is also called hepatic steatosis. In many cases, fatty liver disease does not cause symptoms or problems. It is often diagnosed when tests are being done for other reasons. However, over time, fatty liver can cause inflammation that may lead to more serious liver problems, such as scarring of the liver (cirrhosis) and liver failure. Fatty liver is associated with insulin resistance, increased body fat, high blood pressure (hypertension), and high cholesterol. These are features of metabolic syndrome and increase your risk for stroke, diabetes, and heart disease. What are the causes? This condition may be caused by components of metabolic syndrome: Obesity. Insulin resistance. High cholesterol. Other causes: Alcohol abuse. Poor nutrition. Cushing syndrome. Pregnancy. Certain drugs. Poisons. Some viral infections. What increases the risk? You are more likely to develop this condition if you: Abuse alcohol. Are overweight. Have diabetes. Have hepatitis. Have a high triglyceride level. Are pregnant. What are the signs or symptoms? Fatty liver disease often does not cause symptoms. If symptoms do develop, they can include: Fatigue and weakness. Weight loss. Confusion. Nausea, vomiting, or abdominal pain. Yellowing of your skin and the white parts of your eyes (jaundice). Itchy skin. How is this diagnosed? This condition may be diagnosed by: A physical exam and your medical history. Blood tests. Imaging tests, such as an ultrasound, CT scan, or MRI. A liver biopsy. A small sample of liver tissue is removed using a needle. The sample is then looked at under a microscope. How is this treated? Fatty liver disease is often  caused by other health conditions. Treatment for fatty liver may involve medicines and lifestyle changes to manage conditions such as: Alcoholism. High cholesterol. Diabetes. Being overweight or obese. Follow these instructions at home:  Do not drink alcohol. If you have trouble quitting, ask your health care provider how to safely quit with the help of medicine or a supervised program. This is important to keep your condition from getting worse. Eat a healthy diet as told by your health care provider. Ask your health care provider about working with a dietitian to develop an eating plan. Exercise regularly. This can help you lose weight and control your cholesterol and diabetes. Talk to your health care provider about an exercise plan and which activities are best for you. Take over-the-counter and prescription medicines only as told by your health care provider. Keep all follow-up visits. This is important. Contact a health care provider if: You have trouble controlling your: Blood sugar. This is especially important if you have diabetes. Cholesterol. Drinking of alcohol. Get help right away if: You have abdominal pain. You have jaundice. You have nausea and are vomiting. You vomit blood or material that looks like coffee grounds. You have stools that are black, tar-like, or bloody. Summary Fatty liver disease develops when too much fat builds up in the cells of your liver. Fatty liver disease often causes no symptoms or problems. However, over time, fatty liver can cause inflammation that may lead to more serious liver problems, such as scarring of the liver (cirrhosis). You are more likely to develop this condition if you abuse alcohol, are pregnant, are overweight, have diabetes, have hepatitis, or have high triglyceride or cholesterol levels. Contact your health care provider if you have trouble controlling your blood   sugar, cholesterol, or drinking of alcohol. This information is  not intended to replace advice given to you by your health care provider. Make sure you discuss any questions you have with your health care provider. Document Revised: 08/04/2020 Document Reviewed: 08/04/2020 Elsevier Patient Education  2024 Elsevier Inc.  

## 2023-11-08 NOTE — Progress Notes (Signed)
 Subjective:    Patient ID: Nathan Conway, male    DOB: Sep 30, 1956, 68 y.o.   MRN: [id number]  HPI  Patient presents to clinic today for follow-up of chronic conditions.  DM2: His last A1c was 6.4%, 12/2022.  He is not taking any oral diabetic medication at this time.  He does not check his sugars.  He checks his feet routinely.  His last eye exam was 08/2023, [place].  Flu 09/2021.  Pneumovax 04/2018.  Prevnar 09/2020.  COVID [place] x1.  HTN: His BP today is 120/78.  He is taking losartan  hct as prescribed.  ECG from 07/2020 reviewed.  HLD: His last LDL was 74, triglycerides 877, 12/2018.  He denies myalgias on simvastatin  and ezetimibe .  He does not consume a low-fat diet.  Asthma: Mild, persistent. He denies chronic cough or SOB.  Managed on breo and albuterol .  There are no PFTs on file.  He does not follow with pulmonology.  GERD: Triggered by eating late at night.  He he takes omeprazole  only as needed with some relief of symptoms.  There is no upper GI on file.  Fatty Liver: His last AST/ALT was 82/87, 12/2022.  He does not follow with GI.  Anemia: His last H/H was 13/37.9, 12/2022.  He is not taking any oral iron.  He does not follow with hematology.  Review of Systems     Past Medical History:  Diagnosis Date   Adenomatous colon polyp    Allergy    Asthma    Blood transfusion without reported diagnosis    20-30 yrs ago but not 100% sure    Chicken pox    Diverticulosis    Hyperlipidemia    Hypertension    Personal history of kidney stones    passed stones - no surgery    Current Outpatient Medications  Medication Sig Dispense Refill   albuterol  (VENTOLIN  HFA) 108 (90 Base) MCG/ACT inhaler INAHLE 1 TO 2 PUFFS INTO THE LUNGS EVERY6 HOURS AS NEEDED FOR WHEEZING OR SHORTNESS OF BREATH 8.5 g 2   aspirin 81 MG tablet Take 81 mg by mouth daily.     cholecalciferol (VITAMIN D ) 1000 units tablet Take 1,000 Units by mouth daily.     ezetimibe  (ZETIA ) 10 MG tablet Take 1 tablet  (10 mg total) by mouth daily. 90 tablet 1   fluticasone  furoate-vilanterol (BREO ELLIPTA ) 200-25 MCG/INH AEPB Inhale 2 puffs into the lungs daily.      losartan -hydrochlorothiazide (HYZAAR) 100-12.5 MG tablet TAKE 1 TABLET BY MOUTH ONCE A DAY. PER MD MUST SCHEDULE PHYSICAL EXAM. 90 tablet 1   omeprazole  (PRILOSEC) 20 MG capsule TAKE ONE CAPSULE BY MOUTH DAILY AS NEEDED 90 capsule 1   simvastatin  (ZOCOR ) 20 MG tablet TAKE ONE TABLET BY MOUTH ONCE A DAY AT SIX IN THE EVENING 90 tablet 1   No current facility-administered medications for this visit.    No Known Allergies  Family History  Problem Relation Age of Onset   Hypertension Mother    Alcohol abuse Father    Lung cancer Father    Colon cancer Brother    Diabetes Neg Hx    Heart disease Neg Hx    Stroke Neg Hx    Esophageal cancer Neg Hx    Rectal cancer Neg Hx    Stomach cancer Neg Hx     Social History   Socioeconomic History   Marital status: Married    Spouse name: Not on file   Number of children:  Not on file   Years of education: Not on file   Highest education level: GED or equivalent  Occupational History   Not on file  Tobacco Use   Smoking status: Former    Current packs/day: 0.00    Types: Cigarettes    Quit date: 11/05/1977    Years since quitting: 46.0   Smokeless tobacco: Never  Substance and Sexual Activity   Alcohol use: Not Currently    Alcohol/week: 2.0 standard drinks of alcohol    Types: 2 Shots of liquor per week   Drug use: No   Sexual activity: Not Currently  Other Topics Concern   Not on file  Social History Narrative   Not on file   Social Drivers of Health   Financial Resource Strain: Low Risk  (11/01/2023)   Overall Financial Resource Strain (CARDIA)    Difficulty of Paying Living Expenses: Not very hard  Food Insecurity: No Food Insecurity (11/01/2023)   Hunger Vital Sign    Worried About Running Out of Food in the Last Year: Never true    Ran Out of Food in the Last Year:  Never true  Transportation Needs: No Transportation Needs (11/01/2023)   PRAPARE - Administrator, Civil Service (Medical): No    Lack of Transportation (Non-Medical): No  Physical Activity: Insufficiently Active (11/01/2023)   Exercise Vital Sign    Days of Exercise per Week: 4 days    Minutes of Exercise per Session: 20 min  Stress: Stress Concern Present (11/01/2023)   [employer] of Occupational Health - Occupational Stress Questionnaire    Feeling of Stress : Rather much  Social Connections: Socially Integrated (11/01/2023)   Social Connection and Isolation Panel [NHANES]    Frequency of Communication with Friends and Family: More than three times a week    Frequency of Social Gatherings with Friends and Family: Once a week    Attends Religious Services: More than 4 times per year    Active Member of [employer] or Organizations: No    Attends Engineer, Structural: More than 4 times per year    Marital Status: Married  Catering Manager Violence: Not At Risk (06/27/2023)   Humiliation, Afraid, Rape, and Kick questionnaire    Fear of Current or Ex-Partner: No    Emotionally Abused: No    Physically Abused: No    Sexually Abused: No     Constitutional: Denies fever, malaise, fatigue, headache or abrupt weight changes.  HEENT: Denies eye pain, eye redness, ear pain, ringing in the ears, wax buildup, runny nose, nasal congestion, bloody nose, or sore throat. Respiratory: Pt reports cough. Denies difficulty breathing, shortness of breath, or sputum production.   Cardiovascular: Denies chest pain, chest tightness, palpitations or swelling in the hands or feet.  Gastrointestinal: Pt reports intermittent reflux. Denies abdominal pain, bloating, constipation, diarrhea or blood in the stool.  GU: Denies urgency, frequency, pain with urination, burning sensation, blood in urine, odor or discharge. Musculoskeletal:  Denies difficulty with gait, muscle pain or joint pain  or swelling.  Skin: Denies redness, rashes, lesions or ulcercations.  Neurological: Pt reports intermittent paresthesia of feet. Denies dizziness, difficulty with memory, difficulty with speech or problems with balance and coordination.  Psych: Denies anxiety, depression, SI/HI.  No other specific complaints in a complete review of systems (except as listed in HPI above).  Objective:   Physical Exam  BP 120/78 (BP Location: Left Arm, Patient Position: Sitting, Cuff Size: Large)   Ht  5' 10 (1.778 m)   Wt 193 lb 3.2 oz (87.6 kg)   BMI 27.72 kg/m    Wt Readings from Last 3 Encounters:  12/20/22 195 lb (88.5 kg)  04/06/22 194 lb (88 kg)  10/06/21 195 lb 12.8 oz (88.8 kg)    General: Appears his stated age, overweight, in NAD. Skin: Warm, dry and intact. No ulcerations noted. HEENT: Head: normal shape and size; Eyes: sclera white, no icterus, conjunctiva pink, PERRLA and EOMs intact;  Cardiovascular: Normal rate and rhythm. S1,S2 noted.  No murmur, rubs or gallops noted. No JVD or BLE edema. No carotid bruits noted. Pulmonary/Chest: Normal effort and positive vesicular breath sounds. No respiratory distress. No wheezes, rales or ronchi noted.  Abdomen: Soft and nontender. Normal bowel sounds.  Musculoskeletal: No difficulty with gait.  Neurological: Alert and oriented.    BMET    Component Value Date/Time   NA 139 12/20/2022 1605   K 4.1 12/20/2022 1605   CL 102 12/20/2022 1605   CO2 25 12/20/2022 1605   GLUCOSE 104 (H) 12/20/2022 1605   BUN 21 12/20/2022 1605   CREATININE 0.73 12/20/2022 1605   CALCIUM 9.2 12/20/2022 1605   GFRNONAA 58 (L) 07/05/2020 1402   GFRAA >60 07/05/2020 1402    Lipid Panel     Component Value Date/Time   CHOL 163 12/20/2022 1605   TRIG 122 12/20/2022 1605   HDL 69 12/20/2022 1605   CHOLHDL 2.4 12/20/2022 1605   VLDL 17.8 09/27/2020 1536   LDLCALC 74 12/20/2022 1605    CBC    Component Value Date/Time   WBC 7.2 12/20/2022 1605   RBC  4.12 (L) 12/20/2022 1605   HGB 13.0 (L) 12/20/2022 1605   HCT 37.9 (L) 12/20/2022 1605   PLT 183 12/20/2022 1605   MCV 92.0 12/20/2022 1605   MCH 31.6 12/20/2022 1605   MCHC 34.3 12/20/2022 1605   RDW 13.3 12/20/2022 1605   LYMPHSABS 1.9 07/05/2020 1402   MONOABS 1.3 (H) 07/05/2020 1402   EOSABS 0.1 07/05/2020 1402   BASOSABS 0.1 07/05/2020 1402    Hgb A1C Lab Results  Component Value Date   HGBA1C 6.4 (H) 12/20/2022          Assessment & Plan:   Trigger Finger, Right Middle:  Referral to Dr. Francisco at [facility] for further evaluation and treatment  RTC in 6 months for your welcome to Medicare exam  Angeline Laura, NP

## 2023-11-08 NOTE — Assessment & Plan Note (Signed)
 POCT A1c 6.7% We will check urine microalbumin today Encouraged him to consume a low-carb diet and exercise weight loss No meds Encouraged him to make sure he is getting diabetic eye exams Encourage routine foot exams He will hold off on flu vaccine as he is currently ill Pneumovax and Prevnar UTD Encouraged him to get his COVID booster

## 2023-11-09 LAB — COMPLETE METABOLIC PANEL WITH GFR
AG Ratio: 1.3 (calc) (ref 1.0–2.5)
ALT: 112 U/L — ABNORMAL HIGH (ref 9–46)
AST: 139 U/L — ABNORMAL HIGH (ref 10–35)
Albumin: 4.1 g/dL (ref 3.6–5.1)
Alkaline phosphatase (APISO): 83 U/L (ref 35–144)
BUN: 15 mg/dL (ref 7–25)
CO2: 24 mmol/L (ref 20–32)
Calcium: 9.5 mg/dL (ref 8.6–10.3)
Chloride: 101 mmol/L (ref 98–110)
Creat: 0.87 mg/dL (ref 0.70–1.35)
Globulin: 3.2 g/dL (ref 1.9–3.7)
Glucose, Bld: 153 mg/dL — ABNORMAL HIGH (ref 65–139)
Potassium: 4.1 mmol/L (ref 3.5–5.3)
Sodium: 138 mmol/L (ref 135–146)
Total Bilirubin: 0.9 mg/dL (ref 0.2–1.2)
Total Protein: 7.3 g/dL (ref 6.1–8.1)
eGFR: 93 mL/min/{1.73_m2} (ref >=60)

## 2023-11-09 LAB — LIPID PANEL
Cholesterol: 208 mg/dL — ABNORMAL HIGH (ref <200)
HDL: 58 mg/dL (ref >=40)
LDL Cholesterol (Calc): 129 mg/dL — ABNORMAL HIGH
Non-HDL Cholesterol (Calc): 150 mg/dL — ABNORMAL HIGH (ref <130)
Total CHOL/HDL Ratio: 3.6 (calc) (ref <5.0)
Triglycerides: 106 mg/dL (ref <150)

## 2023-11-09 LAB — CBC
HCT: 45.5 % (ref 38.5–50.0)
Hemoglobin: 15.7 g/dL (ref 13.2–17.1)
MCH: 33.3 pg — ABNORMAL HIGH (ref 27.0–33.0)
MCHC: 34.5 g/dL (ref 32.0–36.0)
MCV: 96.4 fL (ref 80.0–100.0)
MPV: 10.6 fL (ref 7.5–12.5)
Platelets: 220 10*3/uL (ref 140–400)
RBC: 4.72 10*6/uL (ref 4.20–5.80)
RDW: 11.8 % (ref 11.0–15.0)
WBC: 9.7 10*3/uL (ref 3.8–10.8)

## 2023-11-09 LAB — MICROALBUMIN / CREATININE URINE RATIO
Creatinine, Urine: 158 mg/dL (ref 20–320)
Microalb Creat Ratio: 11 mg/g{creat} (ref <30)
Microalb, Ur: 1.8 mg/dL

## 2023-11-09 LAB — IRON,TIBC AND FERRITIN PANEL
%SAT: 37 % (ref 20–48)
Ferritin: 429 ng/mL — ABNORMAL HIGH (ref 24–380)
Iron: 126 ug/dL (ref 50–180)
TIBC: 338 ug/dL (ref 250–425)

## 2023-11-13 ENCOUNTER — Encounter: Payer: Self-pay | Admitting: Internal Medicine

## 2023-12-06 ENCOUNTER — Encounter: Payer: Self-pay | Admitting: Internal Medicine

## 2024-02-04 ENCOUNTER — Telehealth (INDEPENDENT_AMBULATORY_CARE_PROVIDER_SITE_OTHER): Admitting: Internal Medicine

## 2024-02-04 ENCOUNTER — Encounter: Payer: Self-pay | Admitting: Internal Medicine

## 2024-02-04 DIAGNOSIS — K760 Fatty (change of) liver, not elsewhere classified: Secondary | ICD-10-CM | POA: Diagnosis not present

## 2024-02-04 DIAGNOSIS — J4531 Mild persistent asthma with (acute) exacerbation: Secondary | ICD-10-CM

## 2024-02-04 MED ORDER — FLUTICASONE-SALMETEROL 100-50 MCG/ACT IN AEPB: 1.0000 | INHALATION_SPRAY | Freq: Two times a day (BID) | RESPIRATORY_TRACT | 3 refills | Status: DC

## 2024-02-04 MED ORDER — MONTELUKAST SODIUM 10 MG PO TABS: 10.0000 mg | ORAL_TABLET | Freq: Every day | ORAL | 3 refills | Status: DC

## 2024-02-04 MED ORDER — PREDNISONE 10 MG PO TABS: ORAL_TABLET | ORAL | 0 refills | Status: DC

## 2024-02-04 NOTE — Patient Instructions (Signed)
 Asthma, Adult  Asthma is a long-term (chronic) condition that causes recurrent episodes in which the lower airways in the lungs become tight and narrow. The narrowing is caused by inflammation and tightening of the smooth muscle around the lower airways. Asthma episodes, also called asthma attacks or asthma flares, may cause coughing, making high-pitched whistling sounds when you breathe, most often when you breathe out (wheezing), shortness of breath, and chest pain. The airways may produce extra mucus caused by the inflammation and irritation. During an attack, it can be difficult to breathe. Asthma attacks can range from minor to life-threatening. Asthma cannot be cured, but medicines and lifestyle changes can help control it and treat acute attacks. It is important to keep your asthma well controlled so the condition does not interfere with your daily life. What are the causes? This condition is believed to be caused by inherited (genetic) and environmental factors, but its exact cause is not known. What can trigger an asthma attack? Many things can bring on an asthma attack or make symptoms worse. These triggers are different for every person. Common triggers include: Allergens and irritants like mold, dust, pet dander, cockroaches, pollen, air pollution, and chemical odors. Cigarette smoke. Weather changes and cold air. Stress and strong emotional responses such as crying or laughing hard. Certain medications such as aspirin or beta blockers. Infections and inflammatory conditions, such as the flu, a cold, pneumonia, or inflammation of the nasal membranes (rhinitis). Gastroesophageal reflux disease (GERD). What are the signs or symptoms? Symptoms may occur right after exposure to an asthma trigger or hours later and can vary by person. Common signs and symptoms include: Wheezing. Trouble breathing (shortness of breath). Excessive nighttime or early morning coughing. Chest  tightness. Tiredness (fatigue) with minimal activity. Difficulty talking in complete sentences. Poor exercise tolerance. How is this diagnosed? This condition is diagnosed based on: A physical exam and your medical history. Tests, which may include: Lung function studies to evaluate the flow of air in your lungs. Allergy tests. Imaging tests, such as X-rays. How is this treated? There is no cure, but symptoms can be controlled with proper treatment. Treatment usually involves: Identifying and avoiding your asthma triggers. Inhaled medicines. Two types are commonly used to treat asthma, depending on severity: Controller medicines. These help prevent asthma symptoms from occurring. They are taken every day. Fast-acting reliever or rescue medicines. These quickly relieve asthma symptoms. They are used as needed and provide short-term relief. Using other medicines, such as: Allergy medicines, such as antihistamines, if your asthma attacks are triggered by allergens. Immune medicines (immunomodulators). These are medicines that help control the immune system. Using supplemental oxygen. This is only needed during a severe episode. Creating an asthma action plan. An asthma action plan is a written plan for managing and treating your asthma attacks. This plan includes: A list of your asthma triggers and how to avoid them. Information about when medicines should be taken and when their dosage should be changed. Instructions about using a device called a peak flow meter. A peak flow meter measures how well the lungs are working and the severity of your asthma. It helps you monitor your condition. Follow these instructions at home: Take over-the-counter and prescription medicines only as told by your health care provider. Stay up to date on all vaccinations as recommended by your healthcare provider, including vaccines for the flu and pneumonia. Use a peak flow meter and keep track of your peak flow  readings. Understand and use your asthma  action plan to address any asthma flares. Do not smoke or allow anyone to smoke in your home. Contact a health care provider if: You have wheezing, shortness of breath, or a cough that is not responding to medicines. Your medicines are causing side effects, such as a rash, itching, swelling, or trouble breathing. You need to use a reliever medicine more than 2-3 times a week. Your peak flow reading is still at 50-79% of your personal best after following your action plan for 1 hour. You have a fever and shortness of breath. Get help right away if: You are getting worse and do not respond to treatment during an asthma attack. You are short of breath when at rest or when doing very little physical activity. You have difficulty eating, drinking, or talking. You have chest pain or tightness. You develop a fast heartbeat or palpitations. You have a bluish color to your lips or fingernails. You are light-headed or dizzy, or you faint. Your peak flow reading is less than 50% of your personal best. You feel too tired to breathe normally. These symptoms may be an emergency. Get help right away. Call 911. Do not wait to see if the symptoms will go away. Do not drive yourself to the hospital. Summary Asthma is a long-term (chronic) condition that causes recurrent episodes in which the airways become tight and narrow. Asthma episodes, also called asthma attacks or asthma flares, can cause coughing, wheezing, shortness of breath, and chest pain. Asthma cannot be cured, but medicines and lifestyle changes can help keep it well controlled and prevent asthma flares. Make sure you understand how to avoid triggers and how and when to use your medicines. Asthma attacks can range from minor to life-threatening. Get help right away if you have an asthma attack and do not respond to treatment with your usual rescue medicines. This information is not intended to replace  advice given to you by your health care provider. Make sure you discuss any questions you have with your health care provider. Document Revised: 08/09/2021 Document Reviewed: 07/31/2021 Elsevier Patient Education  2024 [employer].

## 2024-02-04 NOTE — Progress Notes (Signed)
 Virtual Visit via Video Note  I connected with Nathan Conway on 02/04/24 at 11:20 AM EDT by a video enabled telemedicine application and verified that I am speaking with the correct person using two identifiers.  Location: Patient: In his car Provider: Office  Person's participating in this video call: Nicki Reaper, NP-C and Krista Som   I discussed the limitations of evaluation and management by telemedicine and the availability of in person appointments. The patient expressed understanding and agreed to proceed.  History of Present Illness: Discussed the use of AI scribe software for clinical note transcription with the patient, who gave verbal consent to proceed.  Nathan Conway is a 68 year old male with asthma who presents with persistent respiratory symptoms following recent flu and respiratory infections.  He has been experiencing persistent respiratory symptoms following a bout of the flu after Christmas, which required an ER visit on December 30th. He subsequently developed a cough and wheezing, for which he sought care at a fast care clinic in [place] on 01/08/24. He was prescribed prednisone for five days, a Z-Pak, and albuterol nebulizer treatments. Despite these treatments, his asthma symptoms have not improved, and he continues to experience significant shortness of breath, wheezing, and a dry cough with occasional mucous production. He describes a sensation of chest tightness but not pain. Occasionally produces mucous with cough, but mostly dry. Reports recent nasal congestion due to pollen. No headaches, ear pain, or sore throat.  He has not been using his Breo inhaler due to insurance coverage issues, as it costs $250 per inhaler. He is unsure of any alternative inhalers that his insurance might cover. He has a history of seeing a pulmonologist in [place] about ten years ago but has not had significant asthma issues until recently.  In addition to his respiratory issues, he has a  history of fatty liver disease. His liver enzymes have been increasing over the past two years, with AST at 139 and ALT at 112, compared to 58 and 66 two years ago. He has not had a liver ultrasound since 2016. He has an upcoming appointment with gastroenterology in April for a colonoscopy, where he may discuss his liver condition further.        Past Medical History:  Diagnosis Date   Adenomatous colon polyp    Allergy    Asthma    Blood transfusion without reported diagnosis    20-30 yrs ago but not 100% sure    Chicken pox    Diverticulosis    Hyperlipidemia    Hypertension    Personal history of kidney stones    passed stones - no surgery    Current Outpatient Medications  Medication Sig Dispense Refill   albuterol (VENTOLIN HFA) 108 (90 Base) MCG/ACT inhaler INAHLE 1 TO 2 PUFFS INTO THE LUNGS EVERY6 HOURS AS NEEDED FOR WHEEZING OR SHORTNESS OF BREATH 8.5 g 2   aspirin 81 MG tablet Take 81 mg by mouth daily.     cholecalciferol (VITAMIN D) 1000 units tablet Take 1,000 Units by mouth daily.     ezetimibe (ZETIA) 10 MG tablet Take 1 tablet (10 mg total) by mouth daily. 90 tablet 1   fluticasone furoate-vilanterol (BREO ELLIPTA) 200-25 MCG/ACT AEPB Inhale 1 puff into the lungs daily. 1 each 11   guaiFENesin-codeine 100-10 MG/5ML syrup Take 10 mLs by mouth every 6 (six) hours as needed.     losartan-hydrochlorothiazide (HYZAAR) 100-12.5 MG tablet TAKE 1 TABLET BY MOUTH ONCE A DAY. PER MD MUST  SCHEDULE PHYSICAL EXAM. 90 tablet 1   omeprazole (PRILOSEC) 20 MG capsule Take 1 capsule (20 mg total) by mouth daily. 90 capsule 1   simvastatin (ZOCOR) 20 MG tablet TAKE ONE TABLET BY MOUTH ONCE A DAY AT SIX IN THE EVENING 90 tablet 1   No current facility-administered medications for this visit.    No Known Allergies  Family History  Problem Relation Age of Onset   Hypertension Mother    Alcohol abuse Father    Lung cancer Father    Colon cancer Brother    Diabetes Neg Hx    Heart  disease Neg Hx    Stroke Neg Hx    Esophageal cancer Neg Hx    Rectal cancer Neg Hx    Stomach cancer Neg Hx     Social History   Socioeconomic History   Marital status: Married    Spouse name: Not on file   Number of children: Not on file   Years of education: Not on file   Highest education level: GED or equivalent  Occupational History   Not on file  Tobacco Use   Smoking status: Former    Current packs/day: 0.00    Types: Cigarettes    Quit date: 11/05/1977    Years since quitting: 46.2   Smokeless tobacco: Never  Substance and Sexual Activity   Alcohol use: Not Currently    Alcohol/week: 2.0 standard drinks of alcohol    Types: 2 Shots of liquor per week   Drug use: No   Sexual activity: Not Currently  Other Topics Concern   Not on file  Social History Narrative   Not on file   Social Drivers of Health   Financial Resource Strain: Low Risk  (11/01/2023)   Overall Financial Resource Strain (CARDIA)    Difficulty of Paying Living Expenses: Not very hard  Food Insecurity: No Food Insecurity (11/01/2023)   Hunger Vital Sign    Worried About Running Out of Food in the Last Year: Never true    Ran Out of Food in the Last Year: Never true  Transportation Needs: No Transportation Needs (11/01/2023)   PRAPARE - Administrator, Civil Service (Medical): No    Lack of Transportation (Non-Medical): No  Physical Activity: Insufficiently Active (11/01/2023)   Exercise Vital Sign    Days of Exercise per Week: 4 days    Minutes of Exercise per Session: 20 min  Stress: Stress Concern Present (11/01/2023)   [employer] of Occupational Health - Occupational Stress Questionnaire    Feeling of Stress : Rather much  Social Connections: Socially Integrated (11/01/2023)   Social Connection and Isolation Panel [NHANES]    Frequency of Communication with Friends and Family: More than three times a week    Frequency of Social Gatherings with Friends and Family: Once  a week    Attends Religious Services: More than 4 times per year    Active Member of [employer] or Organizations: No    Attends Engineer, structural: More than 4 times per year    Marital Status: Married  Catering manager Violence: Not At Risk (06/27/2023)   Humiliation, Afraid, Rape, and Kick questionnaire    Fear of Current or Ex-Partner: No    Emotionally Abused: No    Physically Abused: No    Sexually Abused: No     Constitutional: Denies fever, malaise, fatigue, headache or abrupt weight changes.  HEENT: Patient reports nasal congestion.  Denies eye pain, eye redness,  ear pain, ringing in the ears, wax buildup, runny nose, bloody nose, or sore throat. Respiratory: Pt reports cough, wheezing, shortness of breath. Denies difficulty breathing.   Cardiovascular: Pt reports chest tightness. Denies chest pain, palpitations or swelling in the hands or feet.  Gastrointestinal: Denies abdominal pain, bloating, constipation, diarrhea or blood in the stool.  GU: Denies urgency, frequency, pain with urination, burning sensation, blood in urine, odor or discharge. Musculoskeletal: Denies decrease in range of motion, difficulty with gait, muscle pain or joint pain and swelling.  Skin: Denies redness, rashes, lesions or ulcercations.  Neurological: Denies dizziness, difficulty with memory, difficulty with speech or problems with balance and coordination.  Psych: Denies anxiety, depression, SI/HI.  No other specific complaints in a complete review of systems (except as listed in HPI above).  Observations/Objective:  Wt Readings from Last 3 Encounters:  11/08/23 193 lb 3.2 oz (87.6 kg)  12/20/22 195 lb (88.5 kg)  04/06/22 194 lb (88 kg)    General: Appears his stated age, well developed, well nourished in NAD. HEENT: Head: normal shape and size;  Nose: no congestion noted; Throat/Mouth: hoarseness noted.  Pulmonary/Chest: Normal effort. No respiratory distress.  Neurological: Alert and  oriented.   BMET    Component Value Date/Time   NA 138 11/08/2023 1335   K 4.1 11/08/2023 1335   CL 101 11/08/2023 1335   CO2 24 11/08/2023 1335   GLUCOSE 153 (H) 11/08/2023 1335   BUN 15 11/08/2023 1335   CREATININE 0.87 11/08/2023 1335   CALCIUM 9.5 11/08/2023 1335   GFRNONAA 58 (L) 07/05/2020 1402   GFRAA >60 07/05/2020 1402    Lipid Panel     Component Value Date/Time   CHOL 208 (H) 11/08/2023 1335   TRIG 106 11/08/2023 1335   HDL 58 11/08/2023 1335   CHOLHDL 3.6 11/08/2023 1335   VLDL 17.8 09/27/2020 1536   LDLCALC 129 (H) 11/08/2023 1335    CBC    Component Value Date/Time   WBC 9.7 11/08/2023 1335   RBC 4.72 11/08/2023 1335   HGB 15.7 11/08/2023 1335   HCT 45.5 11/08/2023 1335   PLT 220 11/08/2023 1335   MCV 96.4 11/08/2023 1335   MCH 33.3 (H) 11/08/2023 1335   MCHC 34.5 11/08/2023 1335   RDW 11.8 11/08/2023 1335   LYMPHSABS 1.9 07/05/2020 1402   MONOABS 1.3 (H) 07/05/2020 1402   EOSABS 0.1 07/05/2020 1402   BASOSABS 0.1 07/05/2020 1402    Hgb A1C Lab Results  Component Value Date   HGBA1C 6.7 (A) 11/08/2023       Assessment and Plan:  RTC in 3 months for your annual exam  Follow Up Instructions:    I discussed the assessment and treatment plan with the patient. The patient was provided an opportunity to ask questions and all were answered. The patient agreed with the plan and demonstrated an understanding of the instructions.   The patient was advised to call back or seek an in-person evaluation if the symptoms worsen or if the condition fails to improve as anticipated.   Nicki Reaper, NP

## 2024-02-14 ENCOUNTER — Ambulatory Visit (AMBULATORY_SURGERY_CENTER): Payer: 59 | Admitting: *Deleted

## 2024-02-14 VITALS — Ht 70.0 in | Wt 185.0 lb

## 2024-02-14 DIAGNOSIS — Z860100 Personal history of colon polyps, unspecified: Secondary | ICD-10-CM

## 2024-02-14 DIAGNOSIS — Z800 Family history of malignant neoplasm of digestive organs: Secondary | ICD-10-CM

## 2024-02-14 MED ORDER — PEG 3350-KCL-NA BICARB-NACL 420 G PO SOLR: 4000.0000 mL | Freq: Once | ORAL | 0 refills | Status: AC

## 2024-02-14 NOTE — Progress Notes (Signed)
 Pt's name and DOB verified at the beginning of the pre-visit wit 2 identifiers  Permission given to speak with  Pt denies any difficulty with ambulating,sitting, laying down or rolling side to side  Pt has no issues with ambulation   Pt has no issues moving head neck or swallowing  No egg or soy allergy known to patient   No issues known to pt with past sedation with any surgeries or procedures  Pt denies having issues being intubated  No FH of Malignant Hyperthermia  Pt is not on diet pills or shots  Pt is not on home 02   Pt is not on blood thinners   Pt denies issues with constipation   Pt is not on dialysis  Pt denise any abnormal heart rhythms   Pt denies any upcoming cardiac testing  Patient's chart reviewed by Cathlyn Parsons CNRA prior to pre-visit and patient appropriate for the [facility].  Pre-visit completed and red dot placed by patient's name on their procedure day (on provider's schedule).    Visit by phone  Pt states weight is 185 lb  IInstructions reviewed. Pt given  [facility] main # and MD on call # prior to instructions.  Pt states understanding of instructions. Instructed to review again prior to procedure. Pt states they will.

## 2024-02-20 ENCOUNTER — Ambulatory Visit: Admitting: Internal Medicine

## 2024-02-20 ENCOUNTER — Encounter: Payer: Self-pay | Admitting: Internal Medicine

## 2024-02-20 VITALS — BP 114/74 | HR 84 | Ht 70.0 in | Wt 186.0 lb

## 2024-02-20 DIAGNOSIS — J4530 Mild persistent asthma, uncomplicated: Secondary | ICD-10-CM

## 2024-02-20 DIAGNOSIS — R490 Dysphonia: Secondary | ICD-10-CM

## 2024-02-20 DIAGNOSIS — J301 Allergic rhinitis due to pollen: Secondary | ICD-10-CM

## 2024-02-20 DIAGNOSIS — J339 Nasal polyp, unspecified: Secondary | ICD-10-CM | POA: Diagnosis not present

## 2024-02-20 DIAGNOSIS — Z87891 Personal history of nicotine dependence: Secondary | ICD-10-CM

## 2024-02-20 MED ORDER — FLUTICASONE PROPIONATE 50 MCG/ACT NA SUSP: 1.0000 | Freq: Every day | NASAL | 5 refills | Status: AC

## 2024-02-20 MED ORDER — ALBUTEROL SULFATE (2.5 MG/3ML) 0.083% IN NEBU: 2.5000 mg | INHALATION_SOLUTION | Freq: Four times a day (QID) | RESPIRATORY_TRACT | 5 refills | Status: AC | PRN

## 2024-02-20 NOTE — Patient Instructions (Addendum)
 It was a pleasure to see you today!  Please schedule follow up with myself in 6 months.  If my schedule is not open yet, we will contact you with a reminder closer to that time. Please call [phone] if you haven't heard from [place] a month before, and always call us sooner if issues or concerns arise. You can also send [place] a message through MyChart, but but aware that this is not to be used for urgent issues and it may take up to 5-7 days to receive a reply. Please be aware that you will likely be able to view your results before I have a chance to respond to them. Please give [place] 5 business days to respond to any non-urgent results.    YOUR PLAN:  -ASTHMA: Continue using Advair 100, one puff in the morning and one at night, and albuterol as a rescue inhaler as needed. You should also continue taking Singulair daily. Avoid known asthma triggers like allergens and infections. If your symptoms remain controlled, you may reduce Advair to once daily, but increase it back to twice daily if symptoms worsen. We have also refilled your albuterol for nebulizer use.  -VOICE HOARSENESS: Voice hoarseness is likely due to post-nasal drip from a recent viral infection and seasonal allergies. Singulair may help with nasal drainage. We have prescribed fluticasone (Flonase) nasal spray to help with this. Use it by tilting your chin down, using your right hand for your left nostril and your left hand for your right nostril, aiming towards your sinuses, and avoiding deep sniffing. Continue using over-the-counter antihistamines as needed.  -NASAL POLYPS: Nasal polyps are noncancerous growths in the lining of your nasal passages. You had them removed 40 years ago, and there has been no recurrence. Your nasal exam today showed an open left side and a slightly obstructed right side.  By learning about asthma and how it can be controlled, you take an important step toward managing this disease. Work closely with your asthma  care team to learn all you can about your asthma, how to avoid triggers, what your medications do, and how to take them correctly. With proper care, you can live free of asthma symptoms and maintain a normal, healthy lifestyle.   What is asthma? Asthma is a chronic disease that affects the airways of the lungs. During normal breathing, the bands of muscle that surround the airways are relaxed and air moves freely. During an asthma episode or "attack," there are three main changes that stop air from moving easily through the airways: The bands of muscle that surround the airways tighten and make the airways narrow. This tightening is called bronchospasm.  The lining of the airways becomes swollen or inflamed.  The cells that line the airways produce more mucus, which is thicker than normal and clogs the airways.  These three factors - bronchospasm, inflammation, and mucus production - cause symptoms such as difficulty breathing, wheezing, and coughing.  What are the most common symptoms of asthma? Asthma symptoms are not the same for everyone. They can even change from episode to episode in the same person. Also, you may have only one symptom of asthma, such as cough, but another person may have all the symptoms of asthma. It is important to know all the symptoms of asthma and to be aware that your asthma can present in any of these ways at any time. The most common symptoms include: Coughing, especially at night  Shortness of breath  Wheezing  Chest tightness, pain, or pressure   Who is affected by asthma? Asthma affects 22 million Americans; about 6 million of these are children under age 87. People who have a family history of asthma have an increased risk of developing the disease. Asthma is also more common in people who have allergies or who are exposed to tobacco smoke. However, anyone can develop asthma at any time. Some people may have asthma all of their lives, while others may develop it  as adults.  What causes asthma? The airways in a person with asthma are very sensitive and react to many things, or "triggers." Contact with these triggers causes asthma symptoms. One of the most important parts of asthma control is to identify your triggers and then avoid them when possible. The only trigger you do not want to avoid is exercise. Pre-treatment with medicines before exercise can allow you to stay active yet avoid asthma symptoms. Common asthma triggers include: Infections (colds, viruses, flu, sinus infections)  Exercise  Weather (changes in temperature and/or humidity, cold air)  Tobacco smoke  Allergens (dust mites, pollens, pets, mold spores, cockroaches, and sometimes foods)  Irritants (strong odors from cleaning products, perfume, wood smoke, air pollution)  Strong emotions such as crying or laughing hard  Some medications   How is asthma diagnosed? To diagnose asthma, your doctor will first review your medical history, family history, and symptoms. Your doctor will want to know any past history of breathing problems you may have had, as well as a family history of asthma, allergies, eczema (a bumpy, itchy skin rash caused by allergies), or other lung disease. It is important that you describe your symptoms in detail (cough, wheeze, shortness of breath, chest tightness), including when and how often they occur. The doctor will perform a physical examination and listen to your heart and lungs. He or she may also order breathing tests, allergy tests, blood tests, and chest and sinus X-rays. The tests will find out if you do have asthma and if there are any other conditions that are contributing factors.  How is asthma treated? Asthma can be controlled, but not cured. It is not normal to have frequent symptoms, trouble sleeping, or trouble completing tasks. Appropriate asthma care will prevent symptoms and visits to the emergency room and hospital. Asthma medicines are one of the  mainstays of asthma treatment. The drugs used to treat asthma are explained below.  Anti-inflammatories: These are the most important drugs for most people with asthma. Anti-inflammatory drugs reduce swelling and mucus production in the airways. As a result, airways are less sensitive and less likely to react to triggers. These medications need to be taken daily and may need to be taken for several weeks before they begin to control asthma. Anti-inflammatory medicines lead to fewer symptoms, better airflow, less sensitive airways, less airway damage, and fewer asthma attacks. If taken every day, they CONTROL or prevent asthma symptoms.   Bronchodilators: These drugs relax the muscle bands that tighten around the airways. This action opens the airways, letting more air in and out of the lungs and improving breathing. Bronchodilators also help clear mucus from the lungs. As the airways open, the mucus moves more freely and can be coughed out more easily. In short-acting forms, bronchodilators RELIEVE or stop asthma symptoms by quickly opening the airways and are very helpful during an asthma episode. In long-acting forms, bronchodilators provide CONTROL of asthma symptoms and prevent asthma episodes.  Asthma drugs can be taken in a variety  of ways. Inhaling the medications by using a metered dose inhaler, dry powder inhaler, or nebulizer is one way of taking asthma medicines. Oral medicines (pills or liquids you swallow) may also be prescribed.  Asthma severity Asthma is classified as either "intermittent" (comes and goes) or "persistent" (lasting). Persistent asthma is further described as being mild, moderate, or severe. The severity of asthma is based on how often you have symptoms both during the day and night, as well as by the results of lung function tests and by how well you can perform activities. The "severity" of asthma refers to how "intense" or "strong" your asthma is.  Asthma control Asthma  control is the goal of asthma treatment. Regardless of your asthma severity, it may or may not be controlled. Asthma control means: You are able to do everything you want to do at work and home  You have no (or minimal) asthma symptoms  You do not wake up from your sleep or earlier than usual in the morning due to asthma  You rarely need to use your reliever medicine (inhaler)  Another major part of your treatment is that you are happy with your asthma care and believe your asthma is controlled.  Monitoring symptoms A key part of treatment is keeping track of how well your lungs are working. Monitoring your symptoms, what they are, how and when they happen, and how severe they are, is an important part of being able to control your asthma.  Sometimes asthma is monitored using a peak flow meter. A peak flow (PF) meter measures how fast the air comes out of your lungs. It can help you know when your asthma is getting worse, sometimes even before you have symptoms. By taking daily peak flow readings, you can learn when to adjust medications to keep asthma under good control. It is also used to create your asthma action plan (see below). Your doctor can use your peak flow readings to adjust your treatment plan in some cases.  Asthma Action Plan Based on your history and asthma severity, you and your doctor will develop a care plan called an "asthma action plan." The asthma action plan describes when and how to use your medicines, actions to take when asthma worsens, and when to seek emergency care. Make sure you understand this plan. If you do not, ask your asthma care provider any questions you may have. Your asthma action plan is one of the keys to controlling asthma. Keep it readily available to remind you of what you need to do every day to control asthma and what you need to do when symptoms occur.  Goals of asthma therapy These are the goals of asthma treatment: Live an active, normal life   Prevent chronic and troublesome symptoms  Attend work or school every day  Perform daily activities without difficulty  Stop urgent visits to the doctor, emergency department, or hospital  Use and adjust medications to control asthma with few or no side effects

## 2024-02-20 NOTE — Progress Notes (Signed)
 Nathan Conway    [id number]    January 16, 1956  Primary Care Physician:Baity, Salvadore Oxford, NP  Referring Physician: Lorre Munroe, NP [address] [place],  [place] [address] Reason for Consultation: asthma Date of Consultation: 02/20/2024  Chief complaint:   Chief Complaint  Patient presents with   Consult    Establish care Asthma Pt was having trouble breathing, got some meds from his primary care doctor. Feeling better now.     HPI: Discussed the use of AI scribe software for clinical note transcription with the patient, who gave verbal consent to proceed.  History of Present Illness He is a 68 year old male with asthma who presents for re-establishment of care and management of asthma symptoms.  He has had asthma for over 30 years, which was previously well-managed with minimal use of a rescue inhaler while living in [place]. After moving to [facility], he continued to manage his asthma with a rescue inhaler prescribed by his primary care physician.  In the past year, he has required steroids for asthma exacerbation once, which he describes as typical for him. He previously used [employer] but has not taken it for eight to nine years.   Following a flu infection after Christmas, he experienced a persistent cough that worsened his asthma symptoms, leading to increased shortness of breath and chest tightness during physical activities such as yard work. He resumed using a nebulizer and inhaler, and his primary care physician prescribed additional medications - adair 100 1 puff twice daily, which improved his symptoms except for a persistent hoarseness in his voice, particularly in the mornings.  He also takes Singulair daily, which is a new addition to his regimen. His asthma is triggered by exercise, work-related activities involving climbing stairs, and seasonal allergies, particularly in the fall.  He has a history of nasal polyps removed 40 years ago and quit smoking in 1979 after  smoking two packs a day for ten years. He occasionally experiences reflux, for which he takes omeprazole as needed.  No frequent reflux or heartburn. Voice hoarseness, particularly in the mornings, and occasional sneezing due to seasonal allergies.    Social History   Occupational History   Not on file  Tobacco Use   Smoking status: Former    Current packs/day: 0.00    Average packs/day: 2.0 packs/day for 10.0 years (20.0 ttl pk-yrs)    Types: Cigarettes    Start date: 20    Quit date: 11/05/1977    Years since quitting: 46.3   Smokeless tobacco: Never  Substance and Sexual Activity   Alcohol use: Not Currently    Alcohol/week: 2.0 standard drinks of alcohol    Types: 2 Shots of liquor per week   Drug use: No   Sexual activity: Not Currently    Relevant family history:  Family History  Problem Relation Age of Onset   Hypertension Mother    Alcohol abuse Father    Lung cancer Father    Colon cancer Brother    Diabetes Neg Hx    Heart disease Neg Hx    Stroke Neg Hx    Esophageal cancer Neg Hx    Rectal cancer Neg Hx    Stomach cancer Neg Hx    Colon polyps Neg Hx     Past Medical History:  Diagnosis Date   Adenomatous colon polyp    Asthma    Blood transfusion without reported diagnosis    20-30 yrs ago but not  100% sure    Chicken pox    Chronic kidney disease    Diabetes mellitus without complication (HCC)    Pre-diabetic   Diverticulosis    Hyperlipidemia    Hypertension    Personal history of kidney stones    passed stones - no surgery    Past Surgical History:  Procedure Laterality Date   COLONOSCOPY  2008   In [place] - polyp   HERNIA REPAIR     NASAL SINUS SURGERY     POLYPECTOMY     WISDOM TOOTH EXTRACTION       Physical Exam: Blood pressure 114/74, pulse 84, height 5\' 10"  (1.778 m), weight 186 lb (84.4 kg), SpO2 96%. Gen:      No acute distress ENT:  nasal debris, no nasal polyps, mucus membranes moist Lungs:    No increased  respiratory effort, symmetric chest wall excursion, clear to auscultation bilaterally, no wheezes or crackles CV:         Regular rate and rhythm; no murmurs, rubs, or gallops.  No pedal edema Abd:      + bowel sounds; soft, non-tender; no distension MSK: no acute synovitis of DIP or PIP joints, no mechanics hands.  Skin:      Warm and dry; no rashes Neuro: normal speech, no focal facial asymmetry Psych: alert and oriented x3, normal mood and affect   Data Reviewed/Medical Decision Making:  Independent interpretation of tests: Imaging:  Review of patient's chest xray August 2021 images revealed bilateral intersitial opacities c/w covid pna. The patient's images have been independently reviewed by me.    PFTs:  Labs:  Lab Results  Component Value Date   NA 138 11/08/2023   K 4.1 11/08/2023   CO2 24 11/08/2023   GLUCOSE 153 (H) 11/08/2023   BUN 15 11/08/2023   CREATININE 0.87 11/08/2023   CALCIUM 9.5 11/08/2023   GFR 92.00 09/27/2020   EGFR 93 11/08/2023   GFRNONAA 58 (L) 07/05/2020   Lab Results  Component Value Date   WBC 9.7 11/08/2023   HGB 15.7 11/08/2023   HCT 45.5 11/08/2023   MCV 96.4 11/08/2023   PLT 220 11/08/2023     Immunization status:  Immunization History  Administered Date(s) Administered   Influenza Inj Mdck Quad With Preservative 08/26/2018   Influenza,inj,Quad PF,6+ Mos 09/07/2016, 09/27/2020   Influenza-Unspecified 07/16/2014   Janssen (J&J) SARS-COV-2 Vaccination 01/13/2020   Pneumococcal Conjugate-13 05/18/2015, 09/27/2020   Pneumococcal Polysaccharide-23 05/02/2012, 05/02/2018   Tdap 04/13/2014   Zoster, Live 12/07/2011     I reviewed prior external note(s) from   I reviewed the result(s) of the labs and imaging as noted above.   I have ordered    Assessment and Plan Assessment & Plan Asthma, mild persistent Asthma exacerbation post-viral infection improved with Advair and albuterol. Current regimen maintains control. Oxygen  saturation normal at 96%. - Continue Advair 100, one puff BID. Gargle post-use to prevent thrush. - Continue albuterol as a rescue inhaler PRN. Notify if usage increases to 3-4 times weekly may require increase in ICS - Continue Singulair daily. Consider discontinuation to assess impact, altering one medication at a time. - Avoid asthma triggers such as allergens and infections. - Consider reducing Advair to once daily if symptoms remain controlled; increase to BID if symptoms worsen. - Refill albuterol for nebulizer use.  Voice hoarseness Voice hoarseness likely due to post-nasal drip from recent viral infection and seasonal allergies. Singulair may aid nasal drainage. - Prescribe fluticasone (Flonase) nasal  spray. Instruct on proper use: tilt chin down, use right hand for left nostril and left hand for right nostril, aim towards sinuses, avoid deep sniffing. - Continue OTC antihistamines PRN.  Nasal polyps Nasal polyps excised 40 years ago with no recurrence. Nasal exam reveals open left side and slightly obstructed right side but no visible polyps   Return to Care: Return in about 6 months (around 08/21/2024).  Louie Rover, MD Pulmonary and Critical Care Medicine [facility] Office:[phone]  CC: Carollynn Cirri, NP

## 2024-02-20 NOTE — Progress Notes (Deleted)
 Nathan Conway    [id number]    [birth year]  Primary Care Physician:Baity, Salvadore Oxford, NP  Referring Physician: Lorre Munroe, NP [address] [place],  [place] [address] Reason for Consultation: asthma Date of Consultation: 02/20/2024  Chief complaint:   Chief Complaint  Patient presents with   Consult    Establish care Asthma Pt was having trouble breathing, got some meds from his primary care doctor. Feeling better now.     HPI:  *** Social history:  Occupation: Exposures: Smoking history:  Social History   Occupational History   Not on file  Tobacco Use   Smoking status: Former    Current packs/day: 0.00    Types: Cigarettes    Quit date: 11/05/1977    Years since quitting: 46.3   Smokeless tobacco: Never  Substance and Sexual Activity   Alcohol use: Not Currently    Alcohol/week: 2.0 standard drinks of alcohol    Types: 2 Shots of liquor per week   Drug use: No   Sexual activity: Not Currently    Relevant family history: *** Family History  Problem Relation Age of Onset   Hypertension Mother    Alcohol abuse Father    Lung cancer Father    Colon cancer Brother    Diabetes Neg Hx    Heart disease Neg Hx    Stroke Neg Hx    Esophageal cancer Neg Hx    Rectal cancer Neg Hx    Stomach cancer Neg Hx    Colon polyps Neg Hx     Past Medical History:  Diagnosis Date   Adenomatous colon polyp    Asthma    Blood transfusion without reported diagnosis    20-30 yrs ago but not 100% sure    Chicken pox    Chronic kidney disease    Diabetes mellitus without complication (HCC)    Pre-diabetic   Diverticulosis    Hyperlipidemia    Hypertension    Personal history of kidney stones    passed stones - no surgery    Past Surgical History:  Procedure Laterality Date   COLONOSCOPY  2008   In [place] - polyp   HERNIA REPAIR     NASAL SINUS SURGERY     POLYPECTOMY     WISDOM TOOTH EXTRACTION       Physical Exam: Blood pressure 114/74,  pulse 84, height 5\' 10"  (1.778 m), weight 186 lb (84.4 kg), SpO2 96%. Gen:      No acute distress ENT:  ***no nasal polyps, mucus membranes moist Lungs:    No increased respiratory effort, symmetric chest wall excursion, clear to auscultation bilaterally, ***no wheezes or crackles CV:         Regular rate and rhythm; no murmurs, rubs, or gallops.  No pedal edema Abd:      + bowel sounds; soft, non-tender; no distension MSK: no acute synovitis of DIP or PIP joints, no mechanics hands.  Skin:      Warm and dry; no rashes*** Neuro: normal speech, no focal facial asymmetry Psych: alert and oriented x3, normal mood and affect   Data Reviewed/Medical Decision Making:  Independent interpretation of tests: Imaging:  Review of patient's *** images revealed ***. The patient's images have been independently reviewed by me.    PFTs: I have personally reviewed the patient's PFTs and ***     No data to display  Labs: ***  Immunization status:  Immunization History  Administered Date(s) Administered   Influenza Inj Mdck Quad With Preservative 08/26/2018   Influenza,inj,Quad PF,6+ Mos 09/07/2016, 09/27/2020   Influenza-Unspecified 07/16/2014   Janssen (J&J) SARS-COV-2 Vaccination 01/13/2020   Pneumococcal Conjugate-13 05/18/2015, 09/27/2020   Pneumococcal Polysaccharide-23 05/02/2012, 05/02/2018   Tdap 04/13/2014   Zoster, Live 12/07/2011     I reviewed prior external note(s) from ***  I reviewed the result(s) of the labs and imaging as noted above.   I have ordered ***  Discussion of management or test interpretation with another colleague ***.   Assessment:  *** 1 or more chronic illnesses with severe exacerbation, progression, or side effects of treatment;  *** 1 acute or chronic illness or injury that poses a threat to life or bodily function  Plan/Recommendations:   We discussed disease management and progression at length today.   I spent *** minutes in the  care of this patient today including pre-charting, chart review, review of results, face-to-face care, coordination of care and communication with consultants etc.).  99202  15-29 minutes or Straightforward MDM  09811 30-44 minutes or Low level MDM  [id number] 45-59 minutes or Moderate level MDM  29562 60-74 minutes or High level MDM   Return to Care: No follow-ups on file.  Louie Rover, MD Pulmonary and Critical Care Medicine [facility] [phone]  CC: Carollynn Cirri, NP

## 2024-02-25 ENCOUNTER — Telehealth: Payer: Self-pay | Admitting: Internal Medicine

## 2024-02-25 NOTE — Telephone Encounter (Signed)
 Error

## 2024-02-26 ENCOUNTER — Encounter: Payer: Self-pay | Admitting: Internal Medicine

## 2024-02-27 NOTE — Progress Notes (Unsigned)
 [facility] History and Physical   Primary Care Physician:  Carollynn Cirri, NP   Reason for Procedure:   Hx colon polyps  Plan:    colonoscopy     HPI: Nathan Conway is a 68 y.o. male s/p removal of 3 adenomas and 2021 here for surveillance colonscopy   Past Medical History:  Diagnosis Date   Adenomatous colon polyp    Asthma    Blood transfusion without reported diagnosis    20-30 yrs ago but not 100% sure    Chicken pox    Chronic kidney disease    Diabetes mellitus without complication (HCC)    Pre-diabetic   Diverticulosis    Hyperlipidemia    Hypertension    Personal history of kidney stones    passed stones - no surgery    Past Surgical History:  Procedure Laterality Date   COLONOSCOPY  2008   In [place] - polyp   HERNIA REPAIR     NASAL SINUS SURGERY     POLYPECTOMY     WISDOM TOOTH EXTRACTION      Prior to Admission medications   Medication Sig Start Date End Date Taking? Authorizing Provider  albuterol  (PROVENTIL ) (2.5 MG/3ML) 0.083% nebulizer solution Take 3 mLs (2.5 mg total) by nebulization every 6 (six) hours as needed for wheezing or shortness of breath. 02/20/24   Aleck Hurdle, MD  albuterol  (VENTOLIN  HFA) 108 [phone] Base) MCG/ACT inhaler INAHLE 1 TO 2 PUFFS INTO THE LUNGS EVERY6 HOURS AS NEEDED FOR WHEEZING OR SHORTNESS OF BREATH 11/08/23   Carollynn Cirri, NP  cholecalciferol (VITAMIN D ) 1000 units tablet Take 1,000 Units by mouth daily.    [provider]  ferrous sulfate 325 (65 FE) MG EC tablet Take 325 mg by mouth 3 (three) times daily with meals.    [provider]  fluticasone  (FLONASE ) 50 MCG/ACT nasal spray Place 1 spray into both nostrils daily. 02/20/24   Desai, Nikita S, MD  fluticasone -salmeterol (ADVAIR) 100-50 MCG/ACT AEPB Inhale 1 puff into the lungs 2 (two) times daily. 02/04/24   Carollynn Cirri, NP  losartan -hydrochlorothiazide (HYZAAR) 100-12.5 MG tablet TAKE 1 TABLET BY MOUTH ONCE A DAY. PER MD MUST  SCHEDULE PHYSICAL EXAM. 11/08/23   Carollynn Cirri, NP  montelukast  (SINGULAIR ) 10 MG tablet Take 1 tablet (10 mg total) by mouth at bedtime. 02/04/24   Carollynn Cirri, NP  omeprazole  (PRILOSEC) 20 MG capsule Take 1 capsule (20 mg total) by mouth daily. Patient taking differently: Take 20 mg by mouth daily. As Needed 11/08/23   Carollynn Cirri, NP  predniSONE  (DELTASONE ) 10 MG tablet Take 6 tabs on day 1, 5 tabs on day 2, 4 tabs on day 3, 3 tabs on day 4, 2 tabs on day 5, 1 tab on day 6 02/04/24   Carollynn Cirri, NP  simvastatin  (ZOCOR ) 20 MG tablet TAKE ONE TABLET BY MOUTH ONCE A DAY AT SIX IN THE EVENING 11/08/23   Carollynn Cirri, NP    Current Outpatient Medications  Medication Sig Dispense Refill   albuterol  (PROVENTIL ) (2.5 MG/3ML) 0.083% nebulizer solution Take 3 mLs (2.5 mg total) by nebulization every 6 (six) hours as needed for wheezing or shortness of breath. 75 mL 5   albuterol  (VENTOLIN  HFA) 108 (90 Base) MCG/ACT inhaler INAHLE 1 TO 2 PUFFS INTO THE LUNGS EVERY6 HOURS AS NEEDED FOR WHEEZING OR SHORTNESS OF BREATH 8.5 g 2   cholecalciferol (VITAMIN D ) 1000 units tablet Take 1,000  Units by mouth daily.     ferrous sulfate 325 (65 FE) MG EC tablet Take 325 mg by mouth 3 (three) times daily with meals.     fluticasone  (FLONASE ) 50 MCG/ACT nasal spray Place 1 spray into both nostrils daily. 16 g 5   fluticasone -salmeterol (ADVAIR) 100-50 MCG/ACT AEPB Inhale 1 puff into the lungs 2 (two) times daily. 1 each 3   losartan -hydrochlorothiazide (HYZAAR) 100-12.5 MG tablet TAKE 1 TABLET BY MOUTH ONCE A DAY. PER MD MUST SCHEDULE PHYSICAL EXAM. 90 tablet 1   montelukast  (SINGULAIR ) 10 MG tablet Take 1 tablet (10 mg total) by mouth at bedtime. 30 tablet 3   omeprazole  (PRILOSEC) 20 MG capsule Take 1 capsule (20 mg total) by mouth daily. (Patient taking differently: Take 20 mg by mouth daily. As Needed) 90 capsule 1   predniSONE  (DELTASONE ) 10 MG tablet Take 6 tabs on day 1, 5 tabs on day 2, 4 tabs on day 3,  3 tabs on day 4, 2 tabs on day 5, 1 tab on day 6 21 tablet 0   simvastatin  (ZOCOR ) 20 MG tablet TAKE ONE TABLET BY MOUTH ONCE A DAY AT SIX IN THE EVENING 90 tablet 1   No current facility-administered medications for this visit.    Allergies as of 02/28/2024   (No Known Allergies)    Family History  Problem Relation Age of Onset   Hypertension Mother    Alcohol abuse Father    Lung cancer Father    Colon cancer Brother    Diabetes Neg Hx    Heart disease Neg Hx    Stroke Neg Hx    Esophageal cancer Neg Hx    Rectal cancer Neg Hx    Stomach cancer Neg Hx    Colon polyps Neg Hx     Social History   Socioeconomic History   Marital status: Married    Spouse name: Not on file   Number of children: Not on file   Years of education: Not on file   Highest education level: GED or equivalent  Occupational History   Not on file  Tobacco Use   Smoking status: Former    Current packs/day: 0.00    Average packs/day: 2.0 packs/day for 10.0 years (20.0 ttl pk-yrs)    Types: Cigarettes    Start date: 61    Quit date: 11/05/1977    Years since quitting: 46.3   Smokeless tobacco: Never  Substance and Sexual Activity   Alcohol use: Not Currently    Alcohol/week: 2.0 standard drinks of alcohol    Types: 2 Shots of liquor per week   Drug use: No   Sexual activity: Not Currently  Other Topics Concern   Not on file  Social History Narrative   Not on file   Social Drivers of Health   Financial Resource Strain: Low Risk  (11/01/2023)   Overall Financial Resource Strain (CARDIA)    Difficulty of Paying Living Expenses: Not very hard  Food Insecurity: No Food Insecurity (11/01/2023)   Hunger Vital Sign    Worried About Running Out of Food in the Last Year: Never true    Ran Out of Food in the Last Year: Never true  Transportation Needs: No Transportation Needs (11/01/2023)   PRAPARE - Administrator, Civil Service (Medical): No    Lack of Transportation  (Non-Medical): No  Physical Activity: Insufficiently Active (11/01/2023)   Exercise Vital Sign    Days of Exercise per Week: 4 days  Minutes of Exercise per Session: 20 min  Stress: Stress Concern Present (11/01/2023)   [employer] of Occupational Health - Occupational Stress Questionnaire    Feeling of Stress : Rather much  Social Connections: Socially Integrated (11/01/2023)   Social Connection and Isolation Panel [NHANES]    Frequency of Communication with Friends and Family: More than three times a week    Frequency of Social Gatherings with Friends and Family: Once a week    Attends Religious Services: More than 4 times per year    Active Member of [employer] or Organizations: No    Attends Engineer, structural: More than 4 times per year    Marital Status: Married  Catering manager Violence: Not At Risk (06/27/2023)   Humiliation, Afraid, Rape, and Kick questionnaire    Fear of Current or Ex-Partner: No    Emotionally Abused: No    Physically Abused: No    Sexually Abused: No    Review of Systems: Positive for *** All other review of systems negative except as mentioned in the HPI.  Physical Exam: Vital signs There were no vitals taken for this visit.  General:   Alert,  Well-developed, well-nourished, pleasant and cooperative in NAD Lungs:  Clear throughout to auscultation.   Heart:  Regular rate and rhythm; no murmurs, clicks, rubs,  or gallops. Abdomen:  Soft, nontender and nondistended. Normal bowel sounds.   Neuro/Psych:  Alert and cooperative. Normal mood and affect. A and O x 3   @Conlin Brahm  Tammie Fall, MD, [facility] [phone] (pager) 02/27/2024 10:15 PM@

## 2024-02-28 ENCOUNTER — Encounter: Payer: Self-pay | Admitting: Internal Medicine

## 2024-02-28 ENCOUNTER — Ambulatory Visit (AMBULATORY_SURGERY_CENTER): Payer: 59 | Admitting: Internal Medicine

## 2024-02-28 VITALS — BP 110/48 | HR 52 | Temp 98.0°F | Resp 10 | Ht 70.0 in | Wt 185.0 lb

## 2024-02-28 DIAGNOSIS — Z800 Family history of malignant neoplasm of digestive organs: Secondary | ICD-10-CM

## 2024-02-28 DIAGNOSIS — K635 Polyp of colon: Secondary | ICD-10-CM

## 2024-02-28 DIAGNOSIS — Z1211 Encounter for screening for malignant neoplasm of colon: Secondary | ICD-10-CM

## 2024-02-28 DIAGNOSIS — D122 Benign neoplasm of ascending colon: Secondary | ICD-10-CM

## 2024-02-28 DIAGNOSIS — D123 Benign neoplasm of transverse colon: Secondary | ICD-10-CM

## 2024-02-28 DIAGNOSIS — K5730 Diverticulosis of large intestine without perforation or abscess without bleeding: Secondary | ICD-10-CM

## 2024-02-28 DIAGNOSIS — Z860100 Personal history of colon polyps, unspecified: Secondary | ICD-10-CM

## 2024-02-28 DIAGNOSIS — Z860101 Personal history of adenomatous and serrated colon polyps: Secondary | ICD-10-CM

## 2024-02-28 MED ORDER — SODIUM CHLORIDE 0.9 % IV SOLN
500.0000 mL | INTRAVENOUS | Status: DC
Start: 2024-02-28 — End: 2024-02-28

## 2024-02-28 NOTE — Progress Notes (Signed)
 Vss nad trans to pacu

## 2024-02-28 NOTE — Patient Instructions (Addendum)
 I found and removed 2 polyps - both small  You also have a condition called diverticulosis - common and not usually a problem. Please read the handout provided.  I will let you know pathology results by mail and/or My Chart.  Your next routine colonoscopy should be in 5 years - 2030.  I appreciate the opportunity to care for you. Kenney Peacemaker, MD, [facility]   **Handouts given on polyps and diverticulosis*8   YOU HAD AN ENDOSCOPIC PROCEDURE TODAY AT THE [facility]:   Refer to the procedure report that was given to you for any specific questions about what was found during the examination.  If the procedure report does not answer your questions, please call your gastroenterologist to clarify.  If you requested that your care partner not be given the details of your procedure findings, then the procedure report has been included in a sealed envelope for you to review at your convenience later.  YOU SHOULD EXPECT: Some feelings of bloating in the abdomen. Passage of more gas than usual.  Walking can help get rid of the air that was put into your GI tract during the procedure and reduce the bloating. If you had a lower endoscopy (such as a colonoscopy or flexible sigmoidoscopy) you may notice spotting of blood in your stool or on the toilet paper. If you underwent a bowel prep for your procedure, you may not have a normal bowel movement for a few days.  Please Note:  You might notice some irritation and congestion in your nose or some drainage.  This is from the oxygen used during your procedure.  There is no need for concern and it should clear up in a day or so.  SYMPTOMS TO REPORT IMMEDIATELY:  Following lower endoscopy (colonoscopy or flexible sigmoidoscopy):  Excessive amounts of blood in the stool  Significant tenderness or worsening of abdominal pains  Swelling of the abdomen that is new, acute  Fever of 100F or higher  For urgent or emergent issues, a gastroenterologist  can be reached at any hour by calling [phone]. Do not use MyChart messaging for urgent concerns.    DIET:  We do recommend a small meal at first, but then you may proceed to your regular diet.  Drink plenty of fluids but you should avoid alcoholic beverages for 24 hours.  ACTIVITY:  You should plan to take it easy for the rest of today and you should NOT DRIVE or use heavy machinery until tomorrow (because of the sedation medicines used during the test).    FOLLOW UP: Our staff will call the number listed on your records the next business day following your procedure.  We will call around 7:15- 8:00 am to check on you and address any questions or concerns that you may have regarding the information given to you following your procedure. If we do not reach you, we will leave a message.     If any biopsies were taken you will be contacted by phone or by letter within the next 1-3 weeks.  Please call us  at [phone] if you have not heard about the biopsies in 3 weeks.    SIGNATURES/CONFIDENTIALITY: You and/or your care partner have signed paperwork which will be entered into your electronic medical record.  These signatures attest to the fact that that the information above on your After Visit Summary has been reviewed and is understood.  Full responsibility of the confidentiality of this discharge information lies with you  and/or your care-partner.

## 2024-02-28 NOTE — Progress Notes (Signed)
 Called to room to assist during endoscopic procedure.  Patient ID and intended procedure confirmed with present staff. Received instructions for my participation in the procedure from the performing physician.

## 2024-02-28 NOTE — Op Note (Signed)
 [facility] Patient Name: Nathan Conway Procedure Date: 02/28/2024 10:19 AM MRN: [id number] Endoscopist: Kenney Peacemaker , MD, [id number] Age: 68 Referring MD:  Date of Birth: 1956/02/11 Gender: Male Account #: [id number] Procedure:                Colonoscopy Indications:              Surveillance: Personal history of adenomatous                            polyps on last colonoscopy 3 years ago, Last                            colonoscopy: 2021 Also FHx CRCA in brother around                            age 70 Medicines:                Monitored Anesthesia Care Procedure:                Pre-Anesthesia Assessment:                           - Prior to the procedure, a History and Physical                            was performed, and patient medications and                            allergies were reviewed. The patient's tolerance of                            previous anesthesia was also reviewed. The risks                            and benefits of the procedure and the sedation                            options and risks were discussed with the patient.                            All questions were answered, and informed consent                            was obtained. Prior Anticoagulants: The patient has                            taken no anticoagulant or antiplatelet agents. ASA                            Grade Assessment: III - A patient with severe                            systemic disease. After reviewing the risks and  benefits, the patient was deemed in satisfactory                            condition to undergo the procedure.                           After obtaining informed consent, the colonoscope                            was passed under direct vision. Throughout the                            procedure, the patient's blood pressure, pulse, and                            oxygen saturations were monitored continuously. The                             Olympus CF-HQ190L ([id number]) Colonoscope was                            introduced through the anus and advanced to the the                            cecum, identified by appendiceal orifice and                            ileocecal valve. The colonoscopy was performed                            without difficulty. The patient tolerated the                            procedure well. The quality of the bowel                            preparation was good. The ileocecal valve,                            appendiceal orifice, and rectum were photographed.                            The bowel preparation used was MiraLAx + GoLYTELY                            via extended prep with split dose instruction. Scope In: 10:30:02 AM Scope Out: 10:47:41 AM Scope Withdrawal Time: 0 hours 14 minutes 18 seconds  Total Procedure Duration: 0 hours 17 minutes 39 seconds  Findings:                 The perianal and digital rectal examinations were                            normal.  Two sessile polyps were found in the transverse                            colon and ascending colon. The polyps were                            diminutive in size. These polyps were removed with                            a cold snare. Resection and retrieval were                            complete. Verification of patient identification                            for the specimen was done. Estimated blood loss was                            minimal.                           Multiple diverticula were found in the sigmoid                            colon and descending colon.                           The exam was otherwise without abnormality on                            direct and retroflexion views. Complications:            No immediate complications. Impression:               - Two diminutive polyps in the transverse colon and                            in the ascending colon,  removed with a cold snare.                            Resected and retrieved.                           - Moderate diverticulosis in the sigmoid colon and                            in the descending colon.                           - The examination was otherwise normal on direct                            and retroflexion views. Recommendation:           - Patient has a contact number available for  emergencies. The signs and symptoms of potential                            delayed complications were discussed with the                            patient. Return to normal activities tomorrow.                            Written discharge instructions were provided to the                            patient.                           - Resume previous diet.                           - Continue present medications.                           - Await pathology results.                           - Repeat colonoscopy in 5 years. (hx polyps + FHx                            CRCA brother) Kenney Peacemaker, MD 02/28/2024 11:00:40 AM This report has been signed electronically.

## 2024-02-28 NOTE — Progress Notes (Signed)
 Pt's states no medical or surgical changes since previsit or office visit.

## 2024-03-02 ENCOUNTER — Telehealth: Payer: Self-pay

## 2024-03-02 NOTE — Telephone Encounter (Signed)
  Follow up Call-     02/28/2024    9:51 AM  Call back number  Post procedure Call Back phone  # [phone]  Permission to leave phone message Yes     Patient questions:  Do you have a fever, pain , or abdominal swelling? No. Pain Score  0 *  Have you tolerated food without any problems? Yes.    Have you been able to return to your normal activities? Yes.    Do you have any questions about your discharge instructions: Diet   No. Medications  No. Follow up visit  No.  Do you have questions or concerns about your Care? No.  Actions: * If pain score is 4 or above: No action needed, pain <4.

## 2024-03-03 ENCOUNTER — Encounter: Payer: Self-pay | Admitting: Internal Medicine

## 2024-03-03 LAB — SURGICAL PATHOLOGY

## 2024-04-21 ENCOUNTER — Encounter: Admitting: Internal Medicine

## 2024-04-21 NOTE — Progress Notes (Deleted)
 Subjective:    Patient ID: Nathan Conway, male    DOB: 10-16-1956, 68 y.o.   MRN: [id number]  HPI  Patient presents to clinic today for his annual exam.  Flu: 09/2020 Tetanus: 04/2014 COVID: [employer] x 1 Pneumovax: 04/2018 Prevnar: 09/2020 Zostavax: 12/2011 Shingrix: Never PSA screening: 12/2022 Colon screening: 09/2020 Vision screening: annually Dentist: biannually  Diet: He does eat meat. He consumes fruits and veggies. He tries to avoid fried foods. He drinks mostly Vitamin water, protein drinks and waters Exercise: None  Review of Systems  Past Medical History:  Diagnosis Date   Adenomatous colon polyp    Asthma    Blood transfusion without reported diagnosis    20-30 yrs ago but not 100% sure    Chicken pox    Chronic kidney disease    Diabetes mellitus without complication (HCC)    Pre-diabetic   Diverticulosis    Hyperlipidemia    Hypertension    Personal history of kidney stones    passed stones - no surgery    Current Outpatient Medications  Medication Sig Dispense Refill   albuterol  (PROVENTIL ) (2.5 MG/3ML) 0.083% nebulizer solution Take 3 mLs (2.5 mg total) by nebulization every 6 (six) hours as needed for wheezing or shortness of breath. 75 mL 5   albuterol  (VENTOLIN  HFA) 108 (90 Base) MCG/ACT inhaler INAHLE 1 TO 2 PUFFS INTO THE LUNGS EVERY6 HOURS AS NEEDED FOR WHEEZING OR SHORTNESS OF BREATH 8.5 g 2   cholecalciferol (VITAMIN D ) 1000 units tablet Take 1,000 Units by mouth daily.     ferrous sulfate 325 (65 FE) MG EC tablet Take 325 mg by mouth 3 (three) times daily with meals.     fluticasone  (FLONASE ) 50 MCG/ACT nasal spray Place 1 spray into both nostrils daily. 16 g 5   fluticasone -salmeterol (ADVAIR) 100-50 MCG/ACT AEPB Inhale 1 puff into the lungs 2 (two) times daily. 1 each 3   losartan -hydrochlorothiazide (HYZAAR) 100-12.5 MG tablet TAKE 1 TABLET BY MOUTH ONCE A DAY. PER MD MUST SCHEDULE PHYSICAL EXAM. 90 tablet 1   montelukast  (SINGULAIR ) 10 MG  tablet Take 1 tablet (10 mg total) by mouth at bedtime. 30 tablet 3   omeprazole  (PRILOSEC) 20 MG capsule Take 1 capsule (20 mg total) by mouth daily. (Patient taking differently: Take 20 mg by mouth daily. As Needed) 90 capsule 1   simvastatin  (ZOCOR ) 20 MG tablet TAKE ONE TABLET BY MOUTH ONCE A DAY AT SIX IN THE EVENING 90 tablet 1   No current facility-administered medications for this visit.    No Known Allergies  Family History  Problem Relation Age of Onset   Hypertension Mother    Alcohol abuse Father    Lung cancer Father    Colon cancer Brother    Diabetes Neg Hx    Heart disease Neg Hx    Stroke Neg Hx    Esophageal cancer Neg Hx    Rectal cancer Neg Hx    Stomach cancer Neg Hx    Colon polyps Neg Hx     Social History   Socioeconomic History   Marital status: Married    Spouse name: Not on file   Number of children: Not on file   Years of education: Not on file   Highest education level: GED or equivalent  Occupational History   Not on file  Tobacco Use   Smoking status: Former    Current packs/day: 0.00    Average packs/day: 2.0 packs/day for 10.0 years (20.0 ttl  pk-yrs)    Types: Cigarettes    Start date: 65    Quit date: 11/05/1977    Years since quitting: 46.4   Smokeless tobacco: Never  Substance and Sexual Activity   Alcohol use: Not Currently    Alcohol/week: 2.0 standard drinks of alcohol    Types: 2 Shots of liquor per week   Drug use: No   Sexual activity: Not Currently  Other Topics Concern   Not on file  Social History Narrative   Not on file   Social Drivers of Health   Financial Resource Strain: Low Risk  (11/01/2023)   Overall Financial Resource Strain (CARDIA)    Difficulty of Paying Living Expenses: Not very hard  Food Insecurity: No Food Insecurity (11/01/2023)   Hunger Vital Sign    Worried About Running Out of Food in the Last Year: Never true    Ran Out of Food in the Last Year: Never true  Transportation Needs: No  Transportation Needs (11/01/2023)   PRAPARE - Administrator, Civil Service (Medical): No    Lack of Transportation (Non-Medical): No  Physical Activity: Insufficiently Active (11/01/2023)   Exercise Vital Sign    Days of Exercise per Week: 4 days    Minutes of Exercise per Session: 20 min  Stress: Stress Concern Present (11/01/2023)   [employer] of Occupational Health - Occupational Stress Questionnaire    Feeling of Stress : Rather much  Social Connections: Socially Integrated (11/01/2023)   Social Connection and Isolation Panel    Frequency of Communication with Friends and Family: More than three times a week    Frequency of Social Gatherings with Friends and Family: Once a week    Attends Religious Services: More than 4 times per year    Active Member of [employer] or Organizations: No    Attends Engineer, structural: More than 4 times per year    Marital Status: Married  Catering manager Violence: Not At Risk (06/27/2023)   Humiliation, Afraid, Rape, and Kick questionnaire    Fear of Current or Ex-Partner: No    Emotionally Abused: No    Physically Abused: No    Sexually Abused: No     Constitutional: Denies fever, malaise, fatigue, headache or abrupt weight changes.  HEENT: Denies eye pain, eye redness, ear pain, ringing in the ears, wax buildup, runny nose, nasal congestion, bloody nose, or sore throat. Respiratory: Patient reports intermittent shortness of breath.  Denies difficulty breathing, cough or sputum production.   Cardiovascular: Denies chest pain, chest tightness, palpitations or swelling in the hands or feet.  Gastrointestinal: Pt reports intermittent reflux. Denies abdominal pain, bloating, constipation, diarrhea or blood in the stool.  GU: Pt reports slow urine stream. Denies urgency, frequency, pain with urination, burning sensation, blood in urine, odor or discharge. Musculoskeletal: Denies decrease in range of motion, difficulty with  gait, muscle pain or joint pain and swelling.  Skin: Denies redness, rashes, lesions or ulcercations.  Neurological: Denies dizziness, difficulty with memory, difficulty with speech or problems with balance and coordination.  Psych: Denies anxiety, depression, SI/HI.  No other specific complaints in a complete review of systems (except as listed in HPI above).     Objective:   Physical Exam   There were no vitals taken for this visit.  Wt Readings from Last 3 Encounters:  02/28/24 185 lb (83.9 kg)  02/20/24 186 lb (84.4 kg)  02/14/24 185 lb (83.9 kg)    General: Appears his stated age,  overweight, in NAD. Skin: Warm, dry and intact. No rashes noted. HEENT: Head: normal shape and size; Eyes: sclera white, no icterus, conjunctiva pink, PERRLA and EOMs intact;  Neck:  Neck supple, trachea midline. No masses, lumps or thyromegaly present.  Cardiovascular: Normal rate and rhythm. S1,S2 noted.  No murmur, rubs or gallops noted. No JVD or BLE edema. No carotid bruits noted. Pulmonary/Chest: Normal effort and positive vesicular breath sounds. No respiratory distress. No wheezes, rales or ronchi noted.  Abdomen: Normal bowel sounds.  Musculoskeletal: Strength 5/5 BUE/BLE.  No difficulty with gait.  Neurological: Alert and oriented. Cranial nerves II-XII grossly intact. Coordination normal.  Psychiatric: Mood and affect normal. Behavior is normal. Judgment and thought content normal.    BMET    Component Value Date/Time   NA 138 11/08/2023 1335   K 4.1 11/08/2023 1335   CL 101 11/08/2023 1335   CO2 24 11/08/2023 1335   GLUCOSE 153 (H) 11/08/2023 1335   BUN 15 11/08/2023 1335   CREATININE 0.87 11/08/2023 1335   CALCIUM 9.5 11/08/2023 1335   GFRNONAA 58 (L) 07/05/2020 1402   GFRAA >60 07/05/2020 1402    Lipid Panel     Component Value Date/Time   CHOL 208 (H) 11/08/2023 1335   TRIG 106 11/08/2023 1335   HDL 58 11/08/2023 1335   CHOLHDL 3.6 11/08/2023 1335   VLDL 17.8  09/27/2020 1536   LDLCALC 129 (H) 11/08/2023 1335    CBC    Component Value Date/Time   WBC 9.7 11/08/2023 1335   RBC 4.72 11/08/2023 1335   HGB 15.7 11/08/2023 1335   HCT 45.5 11/08/2023 1335   PLT 220 11/08/2023 1335   MCV 96.4 11/08/2023 1335   MCH 33.3 (H) 11/08/2023 1335   MCHC 34.5 11/08/2023 1335   RDW 11.8 11/08/2023 1335   LYMPHSABS 1.9 07/05/2020 1402   MONOABS 1.3 (H) 07/05/2020 1402   EOSABS 0.1 07/05/2020 1402   BASOSABS 0.1 07/05/2020 1402    Hgb A1C Lab Results  Component Value Date   HGBA1C 6.7 (A) 11/08/2023          Assessment & Plan:   Preventative Health Maintenance:  Encouraged him to get a flu shot in fall Tetanus UTD Encouraged him to get his COVID booster He declines Prevnar 20 today Zostavax UTD Discussed Shingrix vaccine, he will check coverage with his insurance company schedule visit if he would like to have this done Colon screening UTD Encouraged him to consume a balanced diet and exercise regimen Advised him to see an eye doctor and dentist annually We will check CBC, c-Met, lipid, A1c and PSA today  RTC in 6 months, follow-up chronic conditions Helayne Lo, NP

## 2024-05-01 ENCOUNTER — Other Ambulatory Visit: Payer: Self-pay | Admitting: Internal Medicine

## 2024-05-04 NOTE — Telephone Encounter (Signed)
 Requested Prescriptions  Pending Prescriptions Disp Refills   losartan -hydrochlorothiazide (HYZAAR) 100-12.5 MG tablet [Pharmacy Med Name: LOSARTAN  POTASSIUM/HYDROCHLOROTHIAZIDE 100-12.5 TABLET] 90 tablet 0    Sig: TAKE ONE TABLET BY MOUTH ONCE A DAY.  PER MD MUST SCHEDULE PHYSICAL EXAM.     Cardiovascular: ARB + Diuretic Combos Failed - 05/04/2024  1:37 PM      Failed - Last BP in normal range    BP Readings from Last 1 Encounters:  02/28/24 (!) 110/48         Failed - Valid encounter within last 6 months    Recent Outpatient Visits           3 months ago Mild persistent asthma with acute exacerbation   [facility] [place], [place] W, NP              Passed - K in normal range and within 180 days    Potassium  Date Value Ref Range Status  11/08/2023 4.1 3.5 - 5.3 mmol/L Final         Passed - Na in normal range and within 180 days    Sodium  Date Value Ref Range Status  11/08/2023 138 135 - 146 mmol/L Final         Passed - Cr in normal range and within 180 days    Creat  Date Value Ref Range Status  11/08/2023 0.87 0.70 - 1.35 mg/dL Final   Creatinine, Urine  Date Value Ref Range Status  11/08/2023 158 20 - 320 mg/dL Final         Passed - eGFR is 10 or above and within 180 days    GFR calc Af Amer  Date Value Ref Range Status  07/05/2020 >60 >60 mL/min Final   GFR calc non Af Amer  Date Value Ref Range Status  07/05/2020 58 (L) >60 mL/min Final   GFR  Date Value Ref Range Status  09/27/2020 92.00 >60.00 mL/min Final    Comment:    Calculated using the CKD-EPI Creatinine Equation (2021)   eGFR  Date Value Ref Range Status  11/08/2023 93 > OR = 60 mL/min/1.1m2 Final         Passed - Patient is not pregnant      Refused Prescriptions Disp Refills   ezetimibe  (ZETIA ) 10 MG tablet [Pharmacy Med Name: EZETIMIBE  10MG  TABLET] 90 tablet 1    Sig: TAKE ONE TABLET (10 MG TOTAL) BY MOUTH DAILY.     Cardiovascular:  Antilipid -  Sterol Transport Inhibitors Failed - 05/04/2024  1:37 PM      Failed - AST in normal range and within 360 days    AST  Date Value Ref Range Status  11/08/2023 139 (H) 10 - 35 U/L Final         Failed - ALT in normal range and within 360 days    ALT  Date Value Ref Range Status  11/08/2023 112 (H) 9 - 46 U/L Final         Failed - Valid encounter within last 12 months    Recent Outpatient Visits           3 months ago Mild persistent asthma with acute exacerbation    [facility] [place], [place] W, NP              Failed - Lipid Panel in normal range within the last 12 months    Cholesterol  Date Value Ref Range  Status  11/08/2023 208 (H) <200 mg/dL Final   LDL Cholesterol (Calc)  Date Value Ref Range Status  11/08/2023 129 (H) mg/dL (calc) Final    Comment:    Reference range: <100 . Desirable range <100 mg/dL for primary prevention;   <70 mg/dL for patients with CHD or diabetic patients  with > or = 2 CHD risk factors. SABRA LDL-C is now calculated using the Martin-Hopkins  calculation, which is a validated novel method providing  better accuracy than the Friedewald equation in the  estimation of LDL-C.  Gladis APPLETHWAITE et al. SANDREA. 7986;689(80): 2061-2068  (http://education.[facility].com/faq/FAQ164)    Direct LDL  Date Value Ref Range Status  09/07/2016 74.0 mg/dL Final    Comment:    Optimal:  <100 mg/dLNear or Above Optimal:  100-129 mg/dLBorderline High:  130-159 mg/dLHigh:  160-189 mg/dLVery High:  >190 mg/dL   HDL  Date Value Ref Range Status  11/08/2023 58 > OR = 40 mg/dL Final   Triglycerides  Date Value Ref Range Status  11/08/2023 106 <150 mg/dL Final         Passed - Patient is not pregnant

## 2024-05-11 ENCOUNTER — Encounter: Payer: Self-pay | Admitting: Internal Medicine

## 2024-05-12 ENCOUNTER — Ambulatory Visit: Payer: Self-pay | Admitting: Internal Medicine

## 2024-05-25 ENCOUNTER — Other Ambulatory Visit: Payer: Self-pay | Admitting: Internal Medicine

## 2024-05-26 ENCOUNTER — Ambulatory Visit: Admitting: Internal Medicine

## 2024-05-26 ENCOUNTER — Encounter: Payer: Self-pay | Admitting: Internal Medicine

## 2024-05-26 VITALS — BP 102/60 | Ht 70.0 in | Wt 180.0 lb

## 2024-05-26 DIAGNOSIS — E663 Overweight: Secondary | ICD-10-CM | POA: Diagnosis not present

## 2024-05-26 DIAGNOSIS — G8929 Other chronic pain: Secondary | ICD-10-CM

## 2024-05-26 DIAGNOSIS — M25511 Pain in right shoulder: Secondary | ICD-10-CM

## 2024-05-26 DIAGNOSIS — E119 Type 2 diabetes mellitus without complications: Secondary | ICD-10-CM | POA: Diagnosis not present

## 2024-05-26 DIAGNOSIS — Z125 Encounter for screening for malignant neoplasm of prostate: Secondary | ICD-10-CM | POA: Diagnosis not present

## 2024-05-26 DIAGNOSIS — Z0001 Encounter for general adult medical examination with abnormal findings: Secondary | ICD-10-CM | POA: Diagnosis not present

## 2024-05-26 DIAGNOSIS — Z6825 Body mass index (BMI) 25.0-25.9, adult: Secondary | ICD-10-CM

## 2024-05-26 MED ORDER — ALBUTEROL SULFATE HFA 108 (90 BASE) MCG/ACT IN AERS: INHALATION_SPRAY | RESPIRATORY_TRACT | 2 refills | Status: DC

## 2024-05-26 NOTE — Patient Instructions (Signed)
 Health Maintenance, Male  Adopting a healthy lifestyle and getting preventive care are important in promoting health and wellness. Ask your health care provider about:  The right schedule for you to have regular tests and exams.  Things you can do on your own to prevent diseases and keep yourself healthy.  What should I know about diet, weight, and exercise?  Eat a healthy diet    Eat a diet that includes plenty of vegetables, fruits, low-fat dairy products, and lean protein.  Do not eat a lot of foods that are high in solid fats, added sugars, or sodium.  Maintain a healthy weight  Body mass index (BMI) is a measurement that can be used to identify possible weight problems. It estimates body fat based on height and weight. Your health care provider can help determine your BMI and help you achieve or maintain a healthy weight.  Get regular exercise  Get regular exercise. This is one of the most important things you can do for your health. Most adults should:  Exercise for at least 150 minutes each week. The exercise should increase your heart rate and make you sweat (moderate-intensity exercise).  Do strengthening exercises at least twice a week. This is in addition to the moderate-intensity exercise.  Spend less time sitting. Even light physical activity can be beneficial.  Watch cholesterol and blood lipids  Have your blood tested for lipids and cholesterol at 70 years of age, then have this test every 5 years.  You may need to have your cholesterol levels checked more often if:  Your lipid or cholesterol levels are high.  You are older than 70 years of age.  You are at high risk for heart disease.  What should I know about cancer screening?  Many types of cancers can be detected early and may often be prevented. Depending on your health history and family history, you may need to have cancer screening at various ages. This may include screening for:  Colorectal cancer.  Prostate cancer.  Skin cancer.  Lung  cancer.  What should I know about heart disease, diabetes, and high blood pressure?  Blood pressure and heart disease  High blood pressure causes heart disease and increases the risk of stroke. This is more likely to develop in people who have high blood pressure readings or are overweight.  Talk with your health care provider about your target blood pressure readings.  Have your blood pressure checked:  Every 3-5 years if you are 9-95 years of age.  Every year if you are 85 years old or older.  If you are between the ages of 29 and 29 and are a current or former smoker, ask your health care provider if you should have a one-time screening for abdominal aortic aneurysm (AAA).  Diabetes  Have regular diabetes screenings. This checks your fasting blood sugar level. Have the screening done:  Once every three years after age 23 if you are at a normal weight and have a low risk for diabetes.  More often and at a younger age if you are overweight or have a high risk for diabetes.  What should I know about preventing infection?  Hepatitis B  If you have a higher risk for hepatitis B, you should be screened for this virus. Talk with your health care provider to find out if you are at risk for hepatitis B infection.  Hepatitis C  Blood testing is recommended for:  Everyone born from [birth year] through 1967.  Anyone  with known risk factors for hepatitis C.  Sexually transmitted infections (STIs)  You should be screened each year for STIs, including gonorrhea and chlamydia, if:  You are sexually active and are younger than 70 years of age.  You are older than 70 years of age and your health care provider tells you that you are at risk for this type of infection.  Your sexual activity has changed since you were last screened, and you are at increased risk for chlamydia or gonorrhea. Ask your health care provider if you are at risk.  Ask your health care provider about whether you are at high risk for HIV. Your health care provider  may recommend a prescription medicine to help prevent HIV infection. If you choose to take medicine to prevent HIV, you should first get tested for HIV. You should then be tested every 3 months for as long as you are taking the medicine.  Follow these instructions at home:  Alcohol use  Do not drink alcohol if your health care provider tells you not to drink.  If you drink alcohol:  Limit how much you have to 0-2 drinks a day.  Know how much alcohol is in your drink. In the [place] one drink equals one 12 oz bottle of beer (355 mL), one 5 oz glass of wine (148 mL), or one 1 oz glass of hard liquor (44 mL).  Lifestyle  Do not use any products that contain nicotine or tobacco. These products include cigarettes, chewing tobacco, and vaping devices, such as e-cigarettes. If you need help quitting, ask your health care provider.  Do not use street drugs.  Do not share needles.  Ask your health care provider for help if you need support or information about quitting drugs.  General instructions  Schedule regular health, dental, and eye exams.  Stay current with your vaccines.  Tell your health care provider if:  You often feel depressed.  You have ever been abused or do not feel safe at home.  Summary  Adopting a healthy lifestyle and getting preventive care are important in promoting health and wellness.  Follow your health care provider's instructions about healthy diet, exercising, and getting tested or screened for diseases.  Follow your health care provider's instructions on monitoring your cholesterol and blood pressure.  This information is not intended to replace advice given to you by your health care provider. Make sure you discuss any questions you have with your health care provider.  Document Revised: 03/13/2021 Document Reviewed: 03/13/2021  Elsevier Patient Education  2024 [employer].

## 2024-05-26 NOTE — Assessment & Plan Note (Signed)
 Encourage diet and exercise for weight loss

## 2024-05-26 NOTE — Progress Notes (Signed)
 Subjective:    Patient ID: Nathan Conway, male    DOB: 11-03-1956, 68 y.o.   MRN: [id number]  HPI  Patient presents to clinic today for his annual exam.  Flu: 09/2023 Tetanus: 04/2014 COVID: Paulino x 1 Pneumovax: 04/2018 Prevnar: 09/2020 Zostavax: 12/2011 Shingrix: Never PSA screening: 12/2022 Colon screening: 09/2020 Vision screening: annually Dentist: biannually  Diet: He does eat meat. He consumes fruits and veggies. He tries to avoid fried foods. He drinks mostly Vitamin water, protein drinks and waters Exercise: None  Review of Systems  Past Medical History:  Diagnosis Date   Adenomatous colon polyp    Asthma    Blood transfusion without reported diagnosis    20-30 yrs ago but not 100% sure    Chicken pox    Chronic kidney disease    Diabetes mellitus without complication (HCC)    Pre-diabetic   Diverticulosis    Hyperlipidemia    Hypertension    Personal history of kidney stones    passed stones - no surgery    Current Outpatient Medications  Medication Sig Dispense Refill   albuterol  (PROVENTIL ) (2.5 MG/3ML) 0.083% nebulizer solution Take 3 mLs (2.5 mg total) by nebulization every 6 (six) hours as needed for wheezing or shortness of breath. 75 mL 5   albuterol  (VENTOLIN  HFA) 108 (90 Base) MCG/ACT inhaler INAHLE 1 TO 2 PUFFS INTO THE LUNGS EVERY6 HOURS AS NEEDED FOR WHEEZING OR SHORTNESS OF BREATH 8.5 g 2   cholecalciferol (VITAMIN D ) 1000 units tablet Take 1,000 Units by mouth daily.     ferrous sulfate 325 (65 FE) MG EC tablet Take 325 mg by mouth 3 (three) times daily with meals.     fluticasone  (FLONASE ) 50 MCG/ACT nasal spray Place 1 spray into both nostrils daily. 16 g 5   fluticasone -salmeterol (ADVAIR) 100-50 MCG/ACT AEPB Inhale 1 puff into the lungs 2 (two) times daily. 1 each 3   losartan -hydrochlorothiazide (HYZAAR) 100-12.5 MG tablet TAKE ONE TABLET BY MOUTH ONCE A DAY.  PER MD MUST SCHEDULE PHYSICAL EXAM. 90 tablet 0   montelukast  (SINGULAIR ) 10 MG  tablet Take 1 tablet (10 mg total) by mouth at bedtime. 30 tablet 3   omeprazole  (PRILOSEC) 20 MG capsule Take 1 capsule (20 mg total) by mouth daily. (Patient taking differently: Take 20 mg by mouth daily. As Needed) 90 capsule 1   simvastatin  (ZOCOR ) 20 MG tablet TAKE ONE TABLET BY MOUTH ONCE A DAY AT SIX IN THE EVENING 90 tablet 1   No current facility-administered medications for this visit.    No Known Allergies  Family History  Problem Relation Age of Onset   Hypertension Mother    Alcohol abuse Father    Lung cancer Father    Colon cancer Brother    Diabetes Neg Hx    Heart disease Neg Hx    Stroke Neg Hx    Esophageal cancer Neg Hx    Rectal cancer Neg Hx    Stomach cancer Neg Hx    Colon polyps Neg Hx     Social History   Socioeconomic History   Marital status: Married    Spouse name: Not on file   Number of children: Not on file   Years of education: Not on file   Highest education level: GED or equivalent  Occupational History   Not on file  Tobacco Use   Smoking status: Former    Current packs/day: 0.00    Average packs/day: 2.0 packs/day for 10.0 years (20.0  ttl pk-yrs)    Types: Cigarettes    Start date: 42    Quit date: 11/05/1977    Years since quitting: 46.5   Smokeless tobacco: Never  Substance and Sexual Activity   Alcohol use: Not Currently    Alcohol/week: 2.0 standard drinks of alcohol    Types: 2 Shots of liquor per week   Drug use: No   Sexual activity: Not Currently  Other Topics Concern   Not on file  Social History Narrative   Not on file   Social Drivers of Health   Financial Resource Strain: Low Risk  (11/01/2023)   Overall Financial Resource Strain (CARDIA)    Difficulty of Paying Living Expenses: Not very hard  Food Insecurity: No Food Insecurity (11/01/2023)   Hunger Vital Sign    Worried About Running Out of Food in the Last Year: Never true    Ran Out of Food in the Last Year: Never true  Transportation Needs: No  Transportation Needs (11/01/2023)   PRAPARE - Administrator, Civil Service (Medical): No    Lack of Transportation (Non-Medical): No  Physical Activity: Insufficiently Active (11/01/2023)   Exercise Vital Sign    Days of Exercise per Week: 4 days    Minutes of Exercise per Session: 20 min  Stress: Stress Concern Present (11/01/2023)   [employer] of Occupational Health - Occupational Stress Questionnaire    Feeling of Stress : Rather much  Social Connections: Socially Integrated (11/01/2023)   Social Connection and Isolation Panel    Frequency of Communication with Friends and Family: More than three times a week    Frequency of Social Gatherings with Friends and Family: Once a week    Attends Religious Services: More than 4 times per year    Active Member of [employer] or Organizations: No    Attends Engineer, structural: More than 4 times per year    Marital Status: Married  Catering manager Violence: Not At Risk (06/27/2023)   Humiliation, Afraid, Rape, and Kick questionnaire    Fear of Current or Ex-Partner: No    Emotionally Abused: No    Physically Abused: No    Sexually Abused: No     Constitutional: Denies fever, malaise, fatigue, headache or abrupt weight changes.  HEENT: Denies eye pain, eye redness, ear pain, ringing in the ears, wax buildup, runny nose, nasal congestion, bloody nose, or sore throat. Respiratory: Patient reports intermittent shortness of breath.  Denies difficulty breathing, cough or sputum production.   Cardiovascular: Denies chest pain, chest tightness, palpitations or swelling in the hands or feet.  Gastrointestinal: Pt reports intermittent reflux. Denies abdominal pain, bloating, constipation, diarrhea or blood in the stool.  GU: Pt reports slow urine stream. Denies urgency, frequency, pain with urination, burning sensation, blood in urine, odor or discharge. Musculoskeletal: Pt reports intermittent right shoulder pain. Denies  decrease in range of motion, difficulty with gait, muscle pain or joint swelling.  Skin: Denies redness, rashes, lesions or ulcercations.  Neurological: Pt reports paresthesia of feet. Denies dizziness, difficulty with memory, difficulty with speech or problems with balance and coordination.  Psych: Denies anxiety, depression, SI/HI.  No other specific complaints in a complete review of systems (except as listed in HPI above).     Objective:   Physical Exam  BP 102/60 (BP Location: Left Arm, Patient Position: Sitting, Cuff Size: Normal)   Ht 5' 10 (1.778 m)   Wt 180 lb (81.6 kg)   BMI 25.83 kg/m  Wt Readings from Last 3 Encounters:  02/28/24 185 lb (83.9 kg)  02/20/24 186 lb (84.4 kg)  02/14/24 185 lb (83.9 kg)    General: Appears his stated age, overweight, in NAD. Skin: Warm, dry and intact. No ulcerations noted. HEENT: Head: normal shape and size; Eyes: sclera white, no icterus, conjunctiva pink, PERRLA and EOMs intact;  Neck:  Neck supple, trachea midline. No masses, lumps or thyromegaly present.  Cardiovascular: Normal rate and rhythm. S1,S2 noted.  No murmur, rubs or gallops noted. No JVD or BLE edema. No carotid bruits noted. Pulmonary/Chest: Normal effort and positive vesicular breath sounds. No respiratory distress. No wheezes, rales or ronchi noted.  Abdomen: Normal bowel sounds.  Musculoskeletal: Normal internal and external rotation of right shoulder.  No pain with palpation of the shoulder.  Negative drop can test on the right.  Strength 5/5 BUE/BLE.  No difficulty with gait.  Neurological: Alert and oriented. Cranial nerves II-XII grossly intact. Coordination normal.  Psychiatric: Mood and affect normal. Behavior is normal. Judgment and thought content normal.    BMET    Component Value Date/Time   NA 138 11/08/2023 1335   K 4.1 11/08/2023 1335   CL 101 11/08/2023 1335   CO2 24 11/08/2023 1335   GLUCOSE 153 (H) 11/08/2023 1335   BUN 15 11/08/2023 1335    CREATININE 0.87 11/08/2023 1335   CALCIUM 9.5 11/08/2023 1335   GFRNONAA 58 (L) 07/05/2020 1402   GFRAA >60 07/05/2020 1402    Lipid Panel     Component Value Date/Time   CHOL 208 (H) 11/08/2023 1335   TRIG 106 11/08/2023 1335   HDL 58 11/08/2023 1335   CHOLHDL 3.6 11/08/2023 1335   VLDL 17.8 09/27/2020 1536   LDLCALC 129 (H) 11/08/2023 1335    CBC    Component Value Date/Time   WBC 9.7 11/08/2023 1335   RBC 4.72 11/08/2023 1335   HGB 15.7 11/08/2023 1335   HCT 45.5 11/08/2023 1335   PLT 220 11/08/2023 1335   MCV 96.4 11/08/2023 1335   MCH 33.3 (H) 11/08/2023 1335   MCHC 34.5 11/08/2023 1335   RDW 11.8 11/08/2023 1335   LYMPHSABS 1.9 07/05/2020 1402   MONOABS 1.3 (H) 07/05/2020 1402   EOSABS 0.1 07/05/2020 1402   BASOSABS 0.1 07/05/2020 1402    Hgb A1C Lab Results  Component Value Date   HGBA1C 6.7 (A) 11/08/2023          Assessment & Plan:   Preventative Health Maintenance:  Encouraged him to get a flu shot in fall Tetanus due, advised him to go to the pharmacy to get this done Encouraged him to get his COVID booster Pneumovax and Prevnar UTD Discussed Shingrix vaccine, he will check coverage with his insurance company schedule visit if he would like to have this done Colon screening UTD Encouraged him to consume a balanced diet and exercise regimen Advised him to see an eye doctor and dentist annually We will check CBC, c-Met, lipid, A1c and PSA today  Chronic right shoulder pain:  He declines to do an x-ray today He would like referral to orthopedics, Dr. Fae in [place]   RTC in 6 months, follow-up chronic conditions Angeline Laura, NP

## 2024-05-27 ENCOUNTER — Ambulatory Visit: Payer: Self-pay | Admitting: Internal Medicine

## 2024-05-27 LAB — COMPREHENSIVE METABOLIC PANEL WITH GFR
AG Ratio: 2 (calc) (ref 1.0–2.5)
ALT: 16 U/L (ref 9–46)
AST: 18 U/L (ref 10–35)
Albumin: 4.5 g/dL (ref 3.6–5.1)
Alkaline phosphatase (APISO): 58 U/L (ref 35–144)
BUN/Creatinine Ratio: 33 (calc) — ABNORMAL HIGH (ref 6–22)
BUN: 32 mg/dL — ABNORMAL HIGH (ref 7–25)
CO2: 25 mmol/L (ref 20–32)
Calcium: 9.6 mg/dL (ref 8.6–10.3)
Chloride: 104 mmol/L (ref 98–110)
Creat: 0.98 mg/dL (ref 0.70–1.35)
Globulin: 2.3 g/dL (ref 1.9–3.7)
Glucose, Bld: 89 mg/dL (ref 65–99)
Potassium: 3.9 mmol/L (ref 3.5–5.3)
Sodium: 138 mmol/L (ref 135–146)
Total Bilirubin: 0.5 mg/dL (ref 0.2–1.2)
Total Protein: 6.8 g/dL (ref 6.1–8.1)
eGFR: 83 mL/min/1.73m2 (ref >=60)

## 2024-05-27 LAB — CBC
HCT: 41.1 % (ref 38.5–50.0)
Hemoglobin: 13.6 g/dL (ref 13.2–17.1)
MCH: 31.3 pg (ref 27.0–33.0)
MCHC: 33.1 g/dL (ref 32.0–36.0)
MCV: 94.5 fL (ref 80.0–100.0)
MPV: 10.8 fL (ref 7.5–12.5)
Platelets: 183 Thousand/uL (ref 140–400)
RBC: 4.35 Million/uL (ref 4.20–5.80)
RDW: 12.2 % (ref 11.0–15.0)
WBC: 8.9 Thousand/uL (ref 3.8–10.8)

## 2024-05-27 LAB — LIPID PANEL
Cholesterol: 104 mg/dL (ref <200)
HDL: 51 mg/dL (ref >=40)
LDL Cholesterol (Calc): 37 mg/dL
Non-HDL Cholesterol (Calc): 53 mg/dL (ref <130)
Total CHOL/HDL Ratio: 2 (calc) (ref <5.0)
Triglycerides: 80 mg/dL (ref <150)

## 2024-05-27 LAB — HEMOGLOBIN A1C
Hgb A1c MFr Bld: 5.9 % — ABNORMAL HIGH (ref <5.7)
Mean Plasma Glucose: 123 mg/dL
eAG (mmol/L): 6.8 mmol/L

## 2024-05-27 LAB — PSA: PSA: 1.84 ng/mL (ref <=4.00)

## 2024-05-27 NOTE — Telephone Encounter (Signed)
 Requested Prescriptions  Pending Prescriptions Disp Refills   fluticasone -salmeterol (ADVAIR) 100-50 MCG/ACT AEPB [Pharmacy Med Name: FLUTICASONE  PROPIONATE/SALMETEROL 100/50 AERO POW BR ACT] 60 each 3    Sig: INHALE ONE PUFF INTO THE LUNGS TWO TIMES DAILY.     Pulmonology:  Combination Products Passed - 05/27/2024 12:12 PM      Passed - Valid encounter within last 12 months    Recent Outpatient Visits           Yesterday Encounter for general adult medical examination with abnormal findings   [facility] [place], [place] W, NP   3 months ago Mild persistent asthma with acute exacerbation   [place] [facility] [place], Angeline ORN, NP               montelukast  (SINGULAIR ) 10 MG tablet [Pharmacy Med Name: MONTELUKAST  SODIUM 10MG  TABLET] 30 tablet 3    Sig: TAKE ONE TABLET (10 MG TOTAL) BY MOUTH AT BEDTIME.     Pulmonology:  Leukotriene Inhibitors Passed - 05/27/2024 12:12 PM      Passed - Valid encounter within last 12 months    Recent Outpatient Visits           Yesterday Encounter for general adult medical examination with abnormal findings   [facility] [place], [place] W, NP   3 months ago Mild persistent asthma with acute exacerbation   [place] [facility] [place], Angeline ORN, [place]

## 2024-07-02 ENCOUNTER — Ambulatory Visit: Payer: 59

## 2024-07-02 VITALS — Ht 70.0 in | Wt 180.0 lb

## 2024-07-02 DIAGNOSIS — Z0000 Encounter for general adult medical examination without abnormal findings: Secondary | ICD-10-CM

## 2024-07-02 NOTE — Patient Instructions (Addendum)
 Mr. Gales , Thank you for taking time out of your busy schedule to complete your Annual Wellness Visit with me. I enjoyed our conversation and look forward to speaking with you again next year. I, as well as your care team,  appreciate your ongoing commitment to your health goals. Please review the following plan we discussed and let me know if I can assist you in the future. Your Game plan/ To Do List    Referrals: If you haven't heard from the office you've been referred to, please reach out to them at the phone provided.   Follow up Visits: We will see or speak with you next year for your Next Medicare AWV with our clinical staff 07/16/25 @ 11:30a  Have you seen your provider in the last 6 months (3 months if uncontrolled diabetes)? Next appointment with provider 11/25/24 @ 9:20a  Clinician Recommendations:  Aim for 30 minutes of exercise or brisk walking, 6-8 glasses of water, and 5 servings of fruits and vegetables each day.       This is a list of the screenings recommended for you:  Health Maintenance  Topic Date Due   Zoster (Shingles) Vaccine (1 of 2) 06/04/2004   COVID-19 Vaccine (2 - 2024-25 season) 07/07/2023   DTaP/Tdap/Td vaccine (2 - Td or Tdap) 04/13/2024   Flu Shot  06/05/2024   Eye exam for diabetics  07/04/2024   Yearly kidney health urinalysis for diabetes  11/07/2024   Hemoglobin A1C  11/26/2024   Yearly kidney function blood test for diabetes  05/26/2025   Complete foot exam   05/26/2025   Medicare Annual Wellness Visit  07/02/2025   Pneumococcal Vaccine for age over 93 (3 of 3 - PCV20 or PCV21) 09/27/2025   Colon Cancer Screening  02/27/2029   Hepatitis C Screening  Addressed   HPV Vaccine  Aged Out   Meningitis B Vaccine  Aged Out    Advanced directives: (Declined) Advance directive discussed with you today. Even though you declined this today, please call our office should you change your mind, and we can give you the proper paperwork for you to fill  out. Advance Care Planning is important because it:  [x]  Makes sure you receive the medical care that is consistent with your values, goals, and preferences  [x]  It provides guidance to your family and loved ones and reduces their decisional burden about whether or not they are making the right decisions based on your wishes.  Follow the link provided in your after visit summary or read over the paperwork we have mailed to you to help you started getting your Advance Directives in place. If you need assistance in completing these, please reach out to us  so that we can help you!  See attachments for Preventive Care and Fall Prevention Tips.

## 2024-07-02 NOTE — Progress Notes (Signed)
 Subjective:   Nathan Conway is a 68 y.o. who presents for a Medicare Wellness preventive visit.  As a reminder, Annual Wellness Visits don't include a physical exam, and some assessments may be limited, especially if this visit is performed virtually. We may recommend an in-person follow-up visit with your provider if needed.  Visit Complete: Virtual I connected with  Nathan Conway on 07/02/24 by a audio enabled telemedicine application and verified that I am speaking with the correct person using two identifiers.  Patient Location: Home  Provider Location: Home Office  I discussed the limitations of evaluation and management by telemedicine. The patient expressed understanding and agreed to proceed.  Vital Signs: Because this visit was a virtual/telehealth visit, some criteria may be missing or patient reported. Any vitals not documented were not able to be obtained and vitals that have been documented are patient reported.    Persons Participating in Visit: Patient.  AWV Questionnaire: No: Patient Medicare AWV questionnaire was not completed prior to this visit.  Cardiac Risk Factors include: advanced age (>87men, >29 women);male gender;diabetes mellitus;hypertension     Objective:    Today's Vitals   07/02/24 1126  Weight: 180 lb (81.6 kg)  Height: 5' 10 (1.778 m)   Body mass index is 25.83 kg/m.     07/02/2024   11:32 AM 06/27/2023    1:36 PM 07/05/2020    2:00 PM 08/25/2019    1:32 PM 10/03/2015   10:02 AM 09/19/2015    8:14 AM  Advanced Directives  Does Patient Have a Medical Advance Directive? No No No No No  No   Would patient like information on creating a medical advance directive? No - Patient declined No - Patient declined   No - patient declined information       Data saved with a previous flowsheet row definition    Current Medications (verified) Outpatient Encounter Medications as of 07/02/2024  Medication Sig   albuterol  (PROVENTIL ) (2.5 MG/3ML)  0.083% nebulizer solution Take 3 mLs (2.5 mg total) by nebulization every 6 (six) hours as needed for wheezing or shortness of breath.   albuterol  (VENTOLIN  HFA) 108 (90 Base) MCG/ACT inhaler INAHLE 1 TO 2 PUFFS INTO THE LUNGS EVERY6 HOURS AS NEEDED FOR WHEEZING OR SHORTNESS OF BREATH   cholecalciferol (VITAMIN D ) 1000 units tablet Take 1,000 Units by mouth daily.   ferrous sulfate 325 (65 FE) MG EC tablet Take 325 mg by mouth 3 (three) times daily with meals.   fluticasone  (FLONASE ) 50 MCG/ACT nasal spray Place 1 spray into both nostrils daily.   fluticasone -salmeterol (ADVAIR) 100-50 MCG/ACT AEPB INHALE ONE PUFF INTO THE LUNGS TWO TIMES DAILY.   losartan -hydrochlorothiazide (HYZAAR) 100-12.5 MG tablet TAKE ONE TABLET BY MOUTH ONCE A DAY.  PER MD MUST SCHEDULE PHYSICAL EXAM.   montelukast  (SINGULAIR ) 10 MG tablet TAKE ONE TABLET (10 MG TOTAL) BY MOUTH AT BEDTIME.   omeprazole  (PRILOSEC) 20 MG capsule Take 1 capsule (20 mg total) by mouth daily.   simvastatin  (ZOCOR ) 20 MG tablet TAKE ONE TABLET BY MOUTH ONCE A DAY AT SIX IN THE EVENING   No facility-administered encounter medications on file as of 07/02/2024.    Allergies (verified) Patient has no known allergies.   History: Past Medical History:  Diagnosis Date   Adenomatous colon polyp    Anxiety    Asthma    Blood transfusion without reported diagnosis    20-30 yrs ago but not 100% sure    Chicken pox  Chronic kidney disease    Depression    Diabetes mellitus without complication (HCC)    Pre-diabetic   Diverticulosis    Hyperlipidemia    Hypertension    Personal history of kidney stones    passed stones - no surgery   Past Surgical History:  Procedure Laterality Date   COLONOSCOPY  2008   In [place] - polyp   HERNIA REPAIR     NASAL SINUS SURGERY     POLYPECTOMY     WISDOM TOOTH EXTRACTION     Family History  Problem Relation Age of Onset   Hypertension Mother    Alcohol abuse Father    Lung cancer Father     Colon cancer Brother    Cancer Brother    Diabetes Neg Hx    Heart disease Neg Hx    Stroke Neg Hx    Esophageal cancer Neg Hx    Rectal cancer Neg Hx    Stomach cancer Neg Hx    Colon polyps Neg Hx    Social History   Socioeconomic History   Marital status: Married    Spouse name: Not on file   Number of children: Not on file   Years of education: Not on file   Highest education level: GED or equivalent  Occupational History   Not on file  Tobacco Use   Smoking status: Former    Current packs/day: 0.00    Average packs/day: 2.0 packs/day for 10.0 years (20.0 ttl pk-yrs)    Types: Cigarettes    Start date: 72    Quit date: 11/05/1977    Years since quitting: 46.6   Smokeless tobacco: Never  Substance and Sexual Activity   Alcohol use: Not Currently    Alcohol/week: 2.0 standard drinks of alcohol    Types: 2 Shots of liquor per week   Drug use: No   Sexual activity: Not Currently  Other Topics Concern   Not on file  Social History Narrative   Not on file   Social Drivers of Health   Financial Resource Strain: Low Risk  (07/02/2024)   Overall Financial Resource Strain (CARDIA)    Difficulty of Paying Living Expenses: Not hard at all  Food Insecurity: No Food Insecurity (07/02/2024)   Hunger Vital Sign    Worried About Running Out of Food in the Last Year: Never true    Ran Out of Food in the Last Year: Never true  Transportation Needs: No Transportation Needs (07/02/2024)   PRAPARE - Administrator, Civil Service (Medical): No    Lack of Transportation (Non-Medical): No  Physical Activity: Insufficiently Active (07/02/2024)   Exercise Vital Sign    Days of Exercise per Week: 4 days    Minutes of Exercise per Session: 30 min  Stress: No Stress Concern Present (07/02/2024)   [employer] of Occupational Health - Occupational Stress Questionnaire    Feeling of Stress: Not at all  Social Connections: Moderately Integrated (07/02/2024)   Social  Connection and Isolation Panel    Frequency of Communication with Friends and Family: More than three times a week    Frequency of Social Gatherings with Friends and Family: More than three times a week    Attends Religious Services: More than 4 times per year    Active Member of [employer] or Organizations: No    Attends Banker Meetings: Never    Marital Status: Married    Tobacco Counseling Counseling given: Not Answered  Clinical Intake:  Pre-visit preparation completed: Yes  Pain : No/denies pain     BMI - recorded: 25.83 Nutritional Status: BMI 25 -29 Overweight Nutritional Risks: None Diabetes: Yes CBG done?: No Did pt. bring in CBG monitor from home?: No  Lab Results  Component Value Date   HGBA1C 5.9 (H) 05/26/2024   HGBA1C 6.7 (A) 11/08/2023   HGBA1C 6.4 (H) 12/20/2022     How often do you need to have someone help you when you read instructions, pamphlets, or other written materials from your doctor or pharmacy?: 1 - Never  Interpreter Needed?: No  Information entered by :: Rojelio Blush LPN   Activities of Daily Living     07/02/2024   11:31 AM  In your present state of health, do you have any difficulty performing the following activities:  Hearing? 0  Vision? 0  Difficulty concentrating or making decisions? 0  Walking or climbing stairs? 0  Dressing or bathing? 0  Doing errands, shopping? 0  Preparing Food and eating ? N  Using the Toilet? N  In the past six months, have you accidently leaked urine? N  Do you have problems with loss of bowel control? N  Managing your Medications? N  Managing your Finances? N  Housekeeping or managing your Housekeeping? N    Patient Care Team: Antonette Angeline ORN, NP as PCP - General (Internal Medicine)  I have updated your Care Teams any recent Medical Services you may have received from other providers in the past year.     Assessment:   This is a routine wellness examination for  [place].  Hearing/Vision screen Hearing Screening - Comments:: Denies hearing difficulties   Vision Screening - Comments:: Wears rx glasses - up to date with routine eye exams with  Dr Octavia   Goals Addressed               This Visit's Progress     Remain active (pt-stated)         Depression Screen     07/02/2024   11:29 AM 05/26/2024    3:45 PM 11/08/2023    1:35 PM 06/27/2023    1:33 PM 12/20/2022    4:04 PM 04/06/2022    1:56 PM 10/06/2021    2:01 PM  PHQ 2/9 Scores  PHQ - 2 Score 0 0 0 0 0 0 0  PHQ- 9 Score 0 0  0  0 0    Fall Risk     07/02/2024   11:31 AM 05/26/2024    3:45 PM 11/08/2023    1:35 PM 06/27/2023    1:36 PM 12/20/2022    4:05 PM  Fall Risk   Falls in the past year? 0 0 0 0 0  Number falls in past yr: 0   0   Injury with Fall? 0   0 0  Risk for fall due to : No Fall Risks   No Fall Risks No Fall Risks  Follow up Falls evaluation completed   Falls prevention discussed;Falls evaluation completed     MEDICARE RISK AT HOME:  Medicare Risk at Home Any stairs in or around the home?: Yes If so, are there any without handrails?: No Home free of loose throw rugs in walkways, pet beds, electrical cords, etc?: Yes Adequate lighting in your home to reduce risk of falls?: Yes Life alert?: Yes Use of a cane, walker or w/c?: No Grab bars in the bathroom?: No Shower chair or bench in shower?: No  Elevated toilet seat or a handicapped toilet?: Yes  TIMED UP AND GO:  Was the test performed?  No  Cognitive Function: 6CIT completed        07/02/2024   11:31 AM 06/27/2023    1:38 PM  6CIT Screen  What Year? 0 points 0 points  What month? 0 points 0 points  What time? 0 points 0 points  Count back from 20 0 points 0 points  Months in reverse 0 points 2 points  Repeat phrase 0 points 0 points  Total Score 0 points 2 points    Immunizations Immunization History  Administered Date(s) Administered   Influenza Inj Mdck Quad Pf 08/05/2017   Influenza Inj Mdck  Quad With Preservative 08/26/2018   Influenza,inj,Quad PF,6+ Mos 09/07/2016, 09/27/2020   Influenza-Unspecified 07/16/2014   Janssen (J&J) SARS-COV-2 Vaccination 01/13/2020   Pneumococcal Conjugate-13 05/18/2015, 09/27/2020   Pneumococcal Polysaccharide-23 05/02/2012, 05/02/2018   Tdap 04/13/2014   Zoster, Live 12/07/2011    Screening Tests Health Maintenance  Topic Date Due   Zoster Vaccines- Shingrix (1 of 2) 06/04/2004   COVID-19 Vaccine (2 - 2024-25 season) 07/07/2023   DTaP/Tdap/Td (2 - Td or Tdap) 04/13/2024   INFLUENZA VACCINE  06/05/2024   OPHTHALMOLOGY EXAM  07/04/2024   Diabetic kidney evaluation - Urine ACR  11/07/2024   HEMOGLOBIN A1C  11/26/2024   Diabetic kidney evaluation - eGFR measurement  05/26/2025   FOOT EXAM  05/26/2025   Medicare Annual Wellness (AWV)  07/02/2025   Pneumococcal Vaccine: 50+ Years (3 of 3 - PCV20 or PCV21) 09/27/2025   Colonoscopy  02/27/2029   Hepatitis C Screening  Addressed   HPV VACCINES  Aged Out   Meningococcal B Vaccine  Aged Out    Health Maintenance  Health Maintenance Due  Topic Date Due   Zoster Vaccines- Shingrix (1 of 2) 06/04/2004   COVID-19 Vaccine (2 - 2024-25 season) 07/07/2023   DTaP/Tdap/Td (2 - Td or Tdap) 04/13/2024   INFLUENZA VACCINE  06/05/2024   Health Maintenance Items Addressed:   Additional Screening:  Vision Screening: Recommended annual ophthalmology exams for early detection of glaucoma and other disorders of the eye. Would you like a referral to an eye doctor? No    Dental Screening: Recommended annual dental exams for proper oral hygiene  Community Resource Referral / Chronic Care Management: CRR required this visit?  No   CCM required this visit?  No   Plan:    I have personally reviewed and noted the following in the patient's chart:   Medical and social history Use of alcohol, tobacco or illicit drugs  Current medications and supplements including opioid prescriptions. Patient is  not currently taking opioid prescriptions. Functional ability and status Nutritional status Physical activity Advanced directives List of other physicians Hospitalizations, surgeries, and ER visits in previous 12 months Vitals Screenings to include cognitive, depression, and falls Referrals and appointments  In addition, I have reviewed and discussed with patient certain preventive protocols, quality metrics, and best practice recommendations. A written personalized care plan for preventive services as well as general preventive health recommendations were provided to patient.   Rojelio LELON Blush, LPN   1/[id number]/7974   After Visit Summary: (MyChart) Due to this being a telephonic visit, the after visit summary with patients personalized plan was offered to patient via MyChart   Notes: Nothing significant to report at this time.

## 2024-07-27 ENCOUNTER — Other Ambulatory Visit: Payer: Self-pay | Admitting: Internal Medicine

## 2024-07-28 NOTE — Telephone Encounter (Signed)
 Requested Prescriptions  Pending Prescriptions Disp Refills   omeprazole  (PRILOSEC) 20 MG capsule [Pharmacy Med Name: OMEPRAZOLE  20MG  CAPSULE DR] 90 capsule 1    Sig: TAKE ONE CAPSULE BY MOUTH DAILY AS NEEDED     Gastroenterology: Proton Pump Inhibitors Passed - 07/28/2024 11:18 AM      Passed - Valid encounter within last 12 months    Recent Outpatient Visits           2 months ago Encounter for general adult medical examination with abnormal findings   [place] [facility] [place], [place] W, NP   5 months ago Mild persistent asthma with acute exacerbation   [place] [facility] [place], [place], NP               simvastatin  (ZOCOR ) 20 MG tablet [Pharmacy Med Name: SIMVASTATIN  20MG  TABLET] 90 tablet 1    Sig: TAKE ONE TABLET BY MOUTH ONCE A DAY AT SIX IN THE EVENING     Cardiovascular:  Antilipid - Statins Failed - 07/28/2024 11:18 AM      Failed - Lipid Panel in normal range within the last 12 months    Cholesterol  Date Value Ref Range Status  05/26/2024 104 <200 mg/dL Final   LDL Cholesterol (Calc)  Date Value Ref Range Status  05/26/2024 37 mg/dL (calc) Final    Comment:    Reference range: <100 . Desirable range <100 mg/dL for primary prevention;   <70 mg/dL for patients with CHD or diabetic patients  with > or = 2 CHD risk factors. SABRA LDL-C is now calculated using the Martin-Hopkins  calculation, which is a validated novel method providing  better accuracy than the Friedewald equation in the  estimation of LDL-C.  Gladis APPLETHWAITE et al. SANDREA. 7986;689(80): 2061-2068  (http://education.[facility].com/faq/FAQ164)    Direct LDL  Date Value Ref Range Status  09/07/2016 74.0 mg/dL Final    Comment:    Optimal:  <100 mg/dLNear or Above Optimal:  100-129 mg/dLBorderline High:  130-159 mg/dLHigh:  160-189 mg/dLVery High:  >190 mg/dL   HDL  Date Value Ref Range Status  05/26/2024 51 > OR = 40 mg/dL Final   Triglycerides  Date  Value Ref Range Status  05/26/2024 80 <150 mg/dL Final         Passed - Patient is not pregnant      Passed - Valid encounter within last 12 months    Recent Outpatient Visits           2 months ago Encounter for general adult medical examination with abnormal findings   [place] [facility] [place], Angeline ORN, NP   5 months ago Mild persistent asthma with acute exacerbation    [facility] [place], Angeline ORN, [place]

## 2024-07-30 ENCOUNTER — Other Ambulatory Visit: Payer: Self-pay | Admitting: Internal Medicine

## 2024-08-06 ENCOUNTER — Other Ambulatory Visit: Payer: Self-pay | Admitting: Internal Medicine

## 2024-08-07 NOTE — Telephone Encounter (Signed)
 Requested by interface surescripts. Future visit in 11/25/24 Requested Prescriptions  Pending Prescriptions Disp Refills   albuterol  (VENTOLIN  HFA) 108 (90 Base) MCG/ACT inhaler [Pharmacy Med Name: ALBUTEROL  SULFATE HFA HFA AEROSOL SOLN] 8.5 g 2    Sig: INAHLE ONE TO TWO PUFFS INTO THE LUNGS EVERY SIX HOURS AS NEEDED FOR WHEEZING OR SHORTNESS OF BREATH     Pulmonology:  Beta Agonists 2 Passed - 08/07/2024  2:56 PM      Passed - Last BP in normal range    BP Readings from Last 1 Encounters:  05/26/24 102/60         Passed - Last Heart Rate in normal range    Pulse Readings from Last 1 Encounters:  02/28/24 (!) 52         Passed - Valid encounter within last 12 months    Recent Outpatient Visits           2 months ago Encounter for general adult medical examination with abnormal findings   [place] [facility] [place], [place], NP   6 months ago Mild persistent asthma with acute exacerbation   [place] [facility] [place], Angeline ORN, [place]

## 2024-09-16 ENCOUNTER — Other Ambulatory Visit: Payer: Self-pay | Admitting: Internal Medicine

## 2024-09-18 NOTE — Telephone Encounter (Signed)
 Requested Prescriptions  Pending Prescriptions Disp Refills   fluticasone -salmeterol (ADVAIR) 100-50 MCG/ACT AEPB [Pharmacy Med Name: FLUTICASONE  PROPIONATE/SALMETEROL 100/50 AERO POW BR ACT]  3    Sig: INHALE ONE PUFF INTO THE LUNGS TWO TIMES DAILY.     Pulmonology:  Combination Products Passed - 09/18/2024 10:25 AM      Passed - Valid encounter within last 12 months    Recent Outpatient Visits           3 months ago Encounter for general adult medical examination with abnormal findings   [facility] [place], [place], NP   7 months ago Mild persistent asthma with acute exacerbation   [place] [facility] [place], Angeline ORN, NP               montelukast  (SINGULAIR ) 10 MG tablet [Pharmacy Med Name: MONTELUKAST  SODIUM 10MG  TABLET] 30 tablet 3    Sig: TAKE ONE TABLET (10 MG TOTAL) BY MOUTH AT BEDTIME.     Pulmonology:  Leukotriene Inhibitors Passed - 09/18/2024 10:25 AM      Passed - Valid encounter within last 12 months    Recent Outpatient Visits           3 months ago Encounter for general adult medical examination with abnormal findings   [place] [facility] [place], [place], NP   7 months ago Mild persistent asthma with acute exacerbation   [place] [facility] [place], Angeline ORN, [place]

## 2024-11-25 ENCOUNTER — Ambulatory Visit: Admitting: Internal Medicine

## 2024-12-22 ENCOUNTER — Ambulatory Visit: Admitting: Internal Medicine

## 2025-07-16 ENCOUNTER — Ambulatory Visit

## 2025-07-21 ENCOUNTER — Ambulatory Visit
